# Patient Record
Sex: Male | Born: 1985 | Race: White | Hispanic: No | Marital: Married | Smoking: Former smoker
Health system: Southern US, Community
[De-identification: ages and names within clinical notes are randomized; demographics above are authoritative.]

## PROBLEM LIST (undated history)

## (undated) DIAGNOSIS — D67 Hereditary factor IX deficiency: Secondary | ICD-10-CM

## (undated) DIAGNOSIS — I10 Essential (primary) hypertension: Secondary | ICD-10-CM

## (undated) DIAGNOSIS — K219 Gastro-esophageal reflux disease without esophagitis: Secondary | ICD-10-CM

## (undated) DIAGNOSIS — Z8619 Personal history of other infectious and parasitic diseases: Secondary | ICD-10-CM

## (undated) DIAGNOSIS — IMO0001 Reserved for inherently not codable concepts without codable children: Secondary | ICD-10-CM

## (undated) HISTORY — DX: Essential (primary) hypertension: I10

## (undated) HISTORY — DX: Hereditary factor IX deficiency: D67

## (undated) HISTORY — DX: Personal history of other infectious and parasitic diseases: Z86.19

## (undated) HISTORY — DX: Reserved for inherently not codable concepts without codable children: IMO0001

## (undated) HISTORY — DX: Gastro-esophageal reflux disease without esophagitis: K21.9

---

## 2005-07-18 ENCOUNTER — Emergency Department: Payer: Self-pay | Admitting: Internal Medicine

## 2005-07-25 ENCOUNTER — Emergency Department: Payer: Self-pay | Admitting: Emergency Medicine
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
## 2008-01-02 ENCOUNTER — Emergency Department: Payer: Self-pay | Admitting: Emergency Medicine

## 2008-03-15 HISTORY — PX: ELBOW ARTHROSCOPY W/ SYNOVECTOMY: SHX1491

## 2013-02-02 ENCOUNTER — Ambulatory Visit: Payer: Self-pay | Admitting: Family Medicine

## 2013-03-15 DIAGNOSIS — Z8619 Personal history of other infectious and parasitic diseases: Secondary | ICD-10-CM

## 2013-03-15 HISTORY — DX: Personal history of other infectious and parasitic diseases: Z86.19

## 2013-04-05 ENCOUNTER — Ambulatory Visit: Payer: Self-pay | Admitting: Family Medicine

## 2015-03-05 ENCOUNTER — Telehealth: Payer: Self-pay | Admitting: Family Medicine

## 2015-03-05 NOTE — Telephone Encounter (Signed)
Please advise 

## 2015-03-05 NOTE — Telephone Encounter (Signed)
Pt's mother called to reschedule NP appt that was cancelled twice in the past. Pt would like to be seen now. Dr. Nicanor AlconGutierrez's next NP spot is in March. Pt's mother said pt is concerned about his bp. He had gone to the eye doctor and dentist and both said his bp was high. They are wanting a sooner appt. He is currently scheduled for 06/02/15, can we find a 30 spot sooner to get him in or leave him in that spot?

## 2015-03-05 NOTE — Telephone Encounter (Signed)
May schedule in open 30 min slot next week (Tuesday).

## 2015-03-06 NOTE — Telephone Encounter (Signed)
Pt called back and made an appt for 03/11/15.

## 2015-03-06 NOTE — Telephone Encounter (Signed)
Attempted to reach pt's mother, no voicemail set up. Left message on patient's voicemail to call back and schedule appt for next week per Dr. Sharen HonesGutierrez.

## 2015-03-11 ENCOUNTER — Encounter: Payer: Self-pay | Admitting: Family Medicine

## 2015-03-11 ENCOUNTER — Ambulatory Visit (INDEPENDENT_AMBULATORY_CARE_PROVIDER_SITE_OTHER): Payer: 59 | Admitting: Family Medicine

## 2015-03-11 VITALS — BP 154/94 | HR 84 | Temp 98.1°F | Ht 73.0 in | Wt 285.5 lb

## 2015-03-11 DIAGNOSIS — I10 Essential (primary) hypertension: Secondary | ICD-10-CM | POA: Diagnosis not present

## 2015-03-11 DIAGNOSIS — Z8619 Personal history of other infectious and parasitic diseases: Secondary | ICD-10-CM | POA: Insufficient documentation

## 2015-03-11 DIAGNOSIS — K219 Gastro-esophageal reflux disease without esophagitis: Secondary | ICD-10-CM

## 2015-03-11 DIAGNOSIS — E669 Obesity, unspecified: Secondary | ICD-10-CM | POA: Insufficient documentation

## 2015-03-11 DIAGNOSIS — D67 Hereditary factor IX deficiency: Secondary | ICD-10-CM | POA: Diagnosis not present

## 2015-03-11 DIAGNOSIS — IMO0001 Reserved for inherently not codable concepts without codable children: Secondary | ICD-10-CM

## 2015-03-11 MED ORDER — AMLODIPINE BESYLATE 5 MG PO TABS: 5.0000 mg | ORAL_TABLET | Freq: Every day | ORAL | Status: DC

## 2015-03-11 NOTE — Progress Notes (Signed)
Pre visit review using our clinic review tool, if applicable. No additional management support is needed unless otherwise documented below in the visit note. 

## 2015-03-11 NOTE — Assessment & Plan Note (Addendum)
Gives story of elevated readings over the past year.  Elevated on rpt. Discussed DASH diet, reasons to treat hypertension . Will start amlodipine 5mg  daily, advised monitor for flushing and ankle edema. Discussed hereditary vs body habitus contribution. RTC 4-6 wks f/u visit. Baseline EKG today. Baseline labs today. EKG NSR rate 70s, normal axis, intervals, no acute ST/T changes

## 2015-03-11 NOTE — Assessment & Plan Note (Addendum)
Check H pylori stool Ag. Discussed dietary approaches to improve GERD sxs.  Discussed PPI use.  Consider GI referral if no improvement.

## 2015-03-11 NOTE — Assessment & Plan Note (Signed)
Discussed healthy lifestyle and dietary approaches to affect sustainable weight loss. Encouraged increased aerobic exercise.

## 2015-03-11 NOTE — Patient Instructions (Addendum)
labwork today. EKG today. Urine check today. Start amlodipine 5mg  daily sent to your pharmacy. Your goal blood pressure is <140/90. Keep an eye on blood oressure at home or your local pharmacy Work on low salt/sodium diet - goal <1.5gm (1,500mg ) per day. Eat a diet high in fruits/vegetables and whole grains.  Look into mediterranean and DASH diet. Goal activity is 15950min/wk of moderate intensity exercise.  This can be split into 30 minute chunks.  If you are not at this level, you can start with smaller 10-15 min increments and slowly build up activity. Look at www.heart.org for more resources  Return in 4-6 wks for follow up. Ok to continue nexium.  DASH Eating Plan DASH stands for "Dietary Approaches to Stop Hypertension." The DASH eating plan is a healthy eating plan that has been shown to reduce high blood pressure (hypertension). Additional health benefits may include reducing the risk of type 2 diabetes mellitus, heart disease, and stroke. The DASH eating plan may also help with weight loss. WHAT DO I NEED TO KNOW ABOUT THE DASH EATING PLAN? For the DASH eating plan, you will follow these general guidelines:  Choose foods with a percent daily value for sodium of less than 5% (as listed on the food label).  Use salt-free seasonings or herbs instead of table salt or sea salt.  Check with your health care provider or pharmacist before using salt substitutes.  Eat lower-sodium products, often labeled as "lower sodium" or "no salt added."  Eat fresh foods.  Eat more vegetables, fruits, and low-fat dairy products.  Choose whole grains. Look for the word "whole" as the first word in the ingredient list.  Choose fish and skinless chicken or [place] more often than red meat. Limit fish, poultry, and meat to 6 oz (170 g) each day.  Limit sweets, desserts, sugars, and sugary drinks.  Choose heart-healthy fats.  Limit cheese to 1 oz (28 g) per day.  Eat more home-cooked food and less  restaurant, buffet, and fast food.  Limit fried foods.  Cook foods using methods other than frying.  Limit canned vegetables. If you do use them, rinse them well to decrease the sodium.  When eating at a restaurant, ask that your food be prepared with less salt, or no salt if possible. WHAT FOODS CAN I EAT? Seek help from a dietitian for individual calorie needs. Grains Whole grain or whole wheat bread. Brown rice. Whole grain or whole wheat pasta. Quinoa, bulgur, and whole grain cereals. Low-sodium cereals. Corn or whole wheat flour tortillas. Whole grain cornbread. Whole grain crackers. Low-sodium crackers. Vegetables Fresh or frozen vegetables (raw, steamed, roasted, or grilled). Low-sodium or reduced-sodium tomato and vegetable juices. Low-sodium or reduced-sodium tomato sauce and paste. Low-sodium or reduced-sodium canned vegetables.  Fruits All fresh, canned (in natural juice), or frozen fruits. Meat and Other Protein Products Ground beef (85% or leaner), grass-fed beef, or beef trimmed of fat. Skinless chicken or [place]key. Ground chicken or [place]key. Pork trimmed of fat. All fish and seafood. Eggs. Dried beans, peas, or lentils. Unsalted nuts and seeds. Unsalted canned beans. Dairy Low-fat dairy products, such as skim or 1% milk, 2% or reduced-fat cheeses, low-fat ricotta or cottage cheese, or plain low-fat yogurt. Low-sodium or reduced-sodium cheeses. Fats and Oils Tub margarines without trans fats. Light or reduced-fat mayonnaise and salad dressings (reduced sodium). Avocado. Safflower, olive, or canola oils. Natural peanut or almond butter. Other Unsalted popcorn and pretzels. The items listed above may not be a complete list  of recommended foods or beverages. Contact your dietitian for more options. WHAT FOODS ARE NOT RECOMMENDED? Grains White bread. White pasta. White rice. Refined cornbread. Bagels and croissants. Crackers that contain trans fat. Vegetables Creamed or fried  vegetables. Vegetables in a cheese sauce. Regular canned vegetables. Regular canned tomato sauce and paste. Regular tomato and vegetable juices. Fruits Dried fruits. Canned fruit in light or heavy syrup. Fruit juice. Meat and Other Protein Products Fatty cuts of meat. Ribs, chicken wings, bacon, sausage, bologna, salami, chitterlings, fatback, hot dogs, bratwurst, and packaged luncheon meats. Salted nuts and seeds. Canned beans with salt. Dairy Whole or 2% milk, cream, half-and-half, and cream cheese. Whole-fat or sweetened yogurt. Full-fat cheeses or blue cheese. Nondairy creamers and whipped toppings. Processed cheese, cheese spreads, or cheese curds. Condiments Onion and garlic salt, seasoned salt, table salt, and sea salt. Canned and packaged gravies. Worcestershire sauce. Tartar sauce. Barbecue sauce. Teriyaki sauce. Soy sauce, including reduced sodium. Steak sauce. Fish sauce. Oyster sauce. Cocktail sauce. Horseradish. Ketchup and mustard. Meat flavorings and tenderizers. Bouillon cubes. Hot sauce. Tabasco sauce. Marinades. Taco seasonings. Relishes. Fats and Oils Butter, stick margarine, lard, shortening, ghee, and bacon fat. Coconut, palm kernel, or palm oils. Regular salad dressings. Other Pickles and olives. Salted popcorn and pretzels. The items listed above may not be a complete list of foods and beverages to avoid. Contact your dietitian for more information. WHERE CAN I FIND MORE INFORMATION? National Heart, Lung, and Blood Institute: travelstabloid.com   This information is not intended to replace advice given to you by your health care provider. Make sure you discuss any questions you have with your health care provider.   Document Released: 02/18/2011 Document Revised: 03/22/2014 Document Reviewed: 01/03/2013 Elsevier Interactive Patient Education [employer].

## 2015-03-11 NOTE — Assessment & Plan Note (Addendum)
[facility]

## 2015-03-11 NOTE — Progress Notes (Addendum)
BP 154/94 mmHg  Pulse 84  Temp(Src) 98.1 F (36.7 C) (Oral)  Ht 6\' 1"  (1.854 m)  Wt 285 lb 8 oz (129.502 kg)  BMI 37.68 kg/m2   CC: new pt to establish  Subjective:    Patient ID: Michael Barker, male    DOB: 08-01-1985, 29 y.o.   MRN: [id number]  HPI: Michael Barker is a 29 y.o. male presenting on 03/11/2015 for Establish Care   Presents with GF today. Rescheduled last 2 new pt appointments over last few years.  BP elevated last several times this has been checked over the past year. No vision changes, CP/tightness, SOB, leg swelling. Mild headache for 2 days recently.   Gaining weight over the past year - working out. Heavy lifting.   GERD over last several years - currently on nexium 14d course. Also takes tums regularly. Some dysphagia as well. Denies nausea/vomiting, odynophagia, early satiety. GF has been treated several times for H pylori.  Lives alone, with dog. Has GF Edu: AA Occupation: sign Estate agentdesign Activity: weight lifter, gym 2x/wk Diet: good water, some fruits/vegetables, lean meats and eggs  Relevant past medical, surgical, family and social history reviewed and updated as indicated. Interim medical history since our last visit reviewed. Allergies and medications reviewed and updated. No current outpatient prescriptions on file prior to visit.   No current facility-administered medications on file prior to visit.    Review of Systems Per HPI unless specifically indicated in ROS section     Objective:    BP 154/94 mmHg  Pulse 84  Temp(Src) 98.1 F (36.7 C) (Oral)  Ht 6\' 1"  (1.854 m)  Wt 285 lb 8 oz (129.502 kg)  BMI 37.68 kg/m2  Wt Readings from Last 3 Encounters:  03/11/15 285 lb 8 oz (129.502 kg)    Physical Exam  Constitutional: He is oriented to person, place, and time. He appears well-developed and well-nourished. No distress.  HENT:  Head: Normocephalic and atraumatic.  Right Ear: Hearing normal.  Left Ear: Hearing normal.    Mouth/Throat: Uvula is midline, oropharynx is clear and moist and mucous membranes are normal. No oropharyngeal exudate, posterior oropharyngeal edema or posterior oropharyngeal erythema.  Eyes: Conjunctivae and EOM are normal. Pupils are equal, round, and reactive to light. No scleral icterus.  Neck: Normal range of motion. Neck supple.  Cardiovascular: Normal rate, regular rhythm, normal heart sounds and intact distal pulses.   No murmur heard. Pulses:      Radial pulses are 2+ on the right side, and 2+ on the left side.  Pulmonary/Chest: Effort normal and breath sounds normal. No respiratory distress. He has no wheezes. He has no rales.  Musculoskeletal: Normal range of motion. He exhibits no edema.  Lymphadenopathy:    He has no cervical adenopathy.  Neurological: He is alert and oriented to person, place, and time.  CN grossly intact, station and gait intact  Skin: Skin is warm and dry. No rash noted.  Psychiatric: He has a normal mood and affect. His behavior is normal. Judgment and thought content normal.  Nursing note and vitals reviewed.  No results found for this or any previous visit.    Assessment & Plan:   Problem List Items Addressed This Visit    Obesity, Class II, BMI 35-39.9, with comorbidity (HCC)    Discussed healthy lifestyle and dietary approaches to affect sustainable weight loss. Encouraged increased aerobic exercise.      HTN (hypertension) - Primary    Gives story  of elevated readings over the past year.  Elevated on rpt. Discussed DASH diet, reasons to treat hypertension . Will start amlodipine  daily, advised monitor for flushing and ankle edema. Discussed hereditary vs body habitus contribution. RTC 4-6 wks f/u visit. Baseline EKG today. Baseline labs today. EKG NSR rate 70s, normal axis, intervals, no acute ST/T changes      Relevant Medications   amLODipine (NORVASC) 5 MG tablet   Other Relevant Orders   TSH   Basic metabolic panel   EKG  12-Lead (Completed)   Microalbumin / creatinine urine ratio   Hemophilia B (HCC)    Have requested records from [facility] today.      GERD (gastroesophageal reflux disease)    Check H pylori stool Ag. Discussed dietary approaches to improve GERD sxs.  Discussed PPI use.  Consider GI referral if no improvement.      Relevant Medications   esomeprazole (NEXIUM) 40 MG capsule   Other Relevant Orders   H. pylori antigen, stool       Follow up plan: Return in about 4 weeks (around 04/08/2015), or as needed, for follow up visit.

## 2015-03-12 ENCOUNTER — Telehealth: Payer: Self-pay | Admitting: Family Medicine

## 2015-03-12 ENCOUNTER — Encounter: Payer: Self-pay | Admitting: *Deleted

## 2015-03-12 LAB — BASIC METABOLIC PANEL
BUN: 23 mg/dL (ref 6–23)
CALCIUM: 10 mg/dL (ref 8.4–10.5)
CO2: 25 mEq/L (ref 19–32)
CREATININE: 1.24 mg/dL (ref 0.40–1.50)
Chloride: 103 mEq/L (ref 96–112)
GFR: 71.92 mL/min (ref >60.00)
GLUCOSE: 90 mg/dL (ref 70–99)
Potassium: 4 mEq/L (ref 3.5–5.1)
Sodium: 139 mEq/L (ref 135–145)

## 2015-03-12 LAB — MICROALBUMIN / CREATININE URINE RATIO
Creatinine,U: 59 mg/dL
Microalb Creat Ratio: 1.2 mg/g (ref 0.0–30.0)

## 2015-03-12 LAB — TSH: TSH: 1.8 u[IU]/mL (ref 0.35–4.50)

## 2015-03-12 NOTE — Telephone Encounter (Signed)
Patient called to get his lab results.  Please call patient. °

## 2015-03-13 NOTE — Telephone Encounter (Signed)
See lab results. Still awaiting stool test.

## 2015-03-13 NOTE — Telephone Encounter (Signed)
Spoke with patient.

## 2015-03-14 LAB — H. PYLORI ANTIGEN, STOOL: H pylori Ag, Stl: NEGATIVE

## 2015-04-05 ENCOUNTER — Encounter: Payer: Self-pay | Admitting: Family Medicine

## 2015-04-10 ENCOUNTER — Encounter: Payer: Self-pay | Admitting: Family Medicine

## 2015-04-10 ENCOUNTER — Ambulatory Visit (INDEPENDENT_AMBULATORY_CARE_PROVIDER_SITE_OTHER): Payer: 59 | Admitting: Family Medicine

## 2015-04-10 VITALS — BP 136/94 | HR 80 | Temp 98.0°F | Wt 280.2 lb

## 2015-04-10 DIAGNOSIS — IMO0001 Reserved for inherently not codable concepts without codable children: Secondary | ICD-10-CM

## 2015-04-10 DIAGNOSIS — I10 Essential (primary) hypertension: Secondary | ICD-10-CM | POA: Diagnosis not present

## 2015-04-10 DIAGNOSIS — K219 Gastro-esophageal reflux disease without esophagitis: Secondary | ICD-10-CM | POA: Diagnosis not present

## 2015-04-10 MED ORDER — AMLODIPINE BESYLATE 10 MG PO TABS: 10.0000 mg | ORAL_TABLET | Freq: Every day | ORAL | Status: DC

## 2015-04-10 NOTE — Assessment & Plan Note (Signed)
Congratulated on recent healthy changes, weight loss noted.

## 2015-04-10 NOTE — Progress Notes (Signed)
BP 136/94 mmHg  Pulse 80  Temp(Src) 98 F (36.7 C) (Oral)  Wt 280 lb 4 oz (127.121 kg)   CC: 1 mo f/u visit  Subjective:    Patient ID: Michael Barker, male    DOB: 1985-12-20, 30 y.o.   MRN: [id number]  HPI: Michael Barker is a 30 y.o. male presenting on 04/10/2015 for Follow-up   See prior note for details - briefly, established care 03/11/2015 with hypertension and GERD. H pylori test negative. Started on amlodipine  daily. 5lb weight loss with increased aerobic exercise.   Goal is to come off blood pressure regimen.  Has increased cardio (eliptical). Changing diet. Avoiding salt. Drinking water, eating fruits/vegetables. Feels this is sustainable.   GERD improved with healthy diet and lifestyle changes.  Relevant past medical, surgical, family and social history reviewed and updated as indicated. Interim medical history since our last visit reviewed. Allergies and medications reviewed and updated. Current Outpatient Prescriptions on File Prior to Visit  Medication Sig  . Coagulation Factor IX, rFIXFc, (ALPROLIX IV) Inject into the vein as directed. IV every 10 days  . Multiple Vitamins-Minerals (MULTIVITAMIN ADULT PO) Take 1 tablet by mouth daily. Alive  . Creatine 5000 MG PACK Take 1 packet by mouth 2 (two) times a week. Reported on 04/10/2015  . esomeprazole (NEXIUM) 40 MG capsule Take 40 mg by mouth daily at 12 noon. Reported on 04/10/2015   No current facility-administered medications on file prior to visit.    Review of Systems Per HPI unless specifically indicated in ROS section     Objective:    BP 136/94 mmHg  Pulse 80  Temp(Src) 98 F (36.7 C) (Oral)  Wt 280 lb 4 oz (127.121 kg)  Wt Readings from Last 3 Encounters:  04/10/15 280 lb 4 oz (127.121 kg)  03/11/15 285 lb 8 oz (129.502 kg)   Body mass index is 36.98 kg/(m^2).  Physical Exam  Constitutional: He appears well-developed and well-nourished. No distress.  HENT:  Head: Normocephalic  and atraumatic.  Mouth/Throat: Oropharynx is clear and moist. No oropharyngeal exudate.  Eyes: Conjunctivae and EOM are normal. Pupils are equal, round, and reactive to light.  Neck: Normal range of motion. Neck supple. No thyromegaly present.  Cardiovascular: Normal rate, regular rhythm, normal heart sounds and intact distal pulses.   No murmur heard. Pulmonary/Chest: Effort normal and breath sounds normal. No respiratory distress. He has no wheezes. He has no rales.  Musculoskeletal: He exhibits no edema.  Lymphadenopathy:    He has no cervical adenopathy.  Skin: Skin is warm and dry. No rash noted.  Psychiatric: He has a normal mood and affect.  Nursing note and vitals reviewed.  Results for orders placed or performed in visit on 03/11/15  TSH  Result Value Ref Range   TSH 1.80 0.35 - 4.50 uIU/mL  Basic metabolic panel  Result Value Ref Range   Sodium 139 135 - 145 mEq/L   Potassium 4.0 3.5 - 5.1 mEq/L   Chloride 103 96 - 112 mEq/L   CO2 25 19 - 32 mEq/L   Glucose, Bld 90 70 - 99 mg/dL   BUN 23 6 - 23 mg/dL   Creatinine, Ser 4.09 0.40 - 1.50 mg/dL   Calcium 81.1 8.4 - 91.4 mg/dL   GFR 78.29 >56.21 mL/min  H. pylori antigen, stool  Result Value Ref Range   H pylori Ag, Stl Negative Negative  Microalbumin / creatinine urine ratio  Result Value Ref Range  Microalb, Ur <0.7 0.0 - 1.9 mg/dL   Creatinine,U 16.1 mg/dL   Microalb Creat Ratio 1.2 0.0 - 30.0 mg/g      Assessment & Plan:   Problem List Items Addressed This Visit    Obesity, Class II, BMI 35-39.9, with comorbidity (HCC)    Congratulated on recent healthy changes, weight loss noted.       HTN (hypertension) - Primary    Improving readings but diastolic remains elevated. Will increase amlodipine to  daily. Update in 2 wks with readings. Pt will buy large cuff to monitor at home. Pt agrees with plan. If bp well controlled, RTC 1 yr CPE      Relevant Medications   amLODipine (NORVASC) 10 MG tablet   GERD  (gastroesophageal reflux disease)    Improved with healthy diet changes and weight loss. Off PPI.           Follow up plan: Return in about 1 year (around 04/09/2016), or if symptoms worsen or fail to improve, for annual exam, prior fasting for blood work.

## 2015-04-10 NOTE — Progress Notes (Signed)
Pre visit review using our clinic review tool, if applicable. No additional management support is needed unless otherwise documented below in the visit note. 

## 2015-04-10 NOTE — Assessment & Plan Note (Signed)
Improved with healthy diet changes and weight loss. Off PPI.

## 2015-04-10 NOTE — Patient Instructions (Addendum)
Let's increase amlodipine to  daily - double up on current dose until you run out then start higher dose at pharmacy. Give me an update with blood pressure readings in 2 weeks.  Return as needed or in 1 year for physical

## 2015-04-10 NOTE — Assessment & Plan Note (Signed)
Improving readings but diastolic remains elevated. Will increase amlodipine to  daily. Update in 2 wks with readings. Pt will buy large cuff to monitor at home. Pt agrees with plan. If bp well controlled, RTC 1 yr CPE

## 2015-04-24 ENCOUNTER — Telehealth: Payer: Self-pay

## 2015-04-24 ENCOUNTER — Telehealth: Payer: Self-pay | Admitting: Family Medicine

## 2015-04-24 DIAGNOSIS — I10 Essential (primary) hypertension: Secondary | ICD-10-CM

## 2015-04-24 MED ORDER — BENAZEPRIL HCL 10 MG PO TABS: 10.0000 mg | ORAL_TABLET | Freq: Every day | ORAL | Status: DC

## 2015-04-24 MED ORDER — AMLODIPINE BESYLATE 5 MG PO TABS: 5.0000 mg | ORAL_TABLET | Freq: Every day | ORAL | Status: DC

## 2015-04-24 NOTE — Telephone Encounter (Signed)
Pt notified of Dr. Timoteo Expose comments / instructions and that Rxs were sent to pharmacy. Pt was not around calender so he will call back to schedule lab appt and he will also call back to update [place] on BP in about 2-3 weeks

## 2015-04-24 NOTE — Telephone Encounter (Addendum)
Let's decrease amlodipine back to  daily. Would recommend start second bp med benazepril  sent to pharmacy. Ok to take both in AM Return for lab visit only in ~10d and update [place] with readings and effect in 2-3 wks.

## 2015-04-24 NOTE — Telephone Encounter (Signed)
See phone note from [facility] earlier today.

## 2015-04-24 NOTE — Telephone Encounter (Signed)
Patient Name: Michael Barker  DOB: [birth year]    Initial Comment Caller states he's having ankle swelling.   Nurse Assessment  Nurse: Vickey Sages, RN, Jacquilin Date/Time (Eastern Time): 04/24/2015 10:56:56 AM  Confirm and document reason for call. If symptomatic, describe symptoms. You must click the next button to save text entered. ---Caller states he's having ankle swelling. Caller states he spoke with the nurse last night (call ID [phone]) Caller states he is calling because the swelling started after increasing his Amalodipine about 1 week ago. The patient states he would like to have a call back from someone in the office or his PCP.  Has the patient traveled out of the country within the last 30 days? ---No  Does the patient have any new or worsening symptoms? ---Yes  Will a triage be completed? ---Yes  Related visit to physician within the last 2 weeks? ---Yes  Does the PT have any chronic conditions? (i.e. diabetes, asthma, etc.) ---Yes  List chronic conditions. ---HTN. GERD  Is this a behavioral health or substance abuse call? ---No     Guidelines    Guideline Title Affirmed Question Affirmed Notes  Leg Swelling and Edema [1] MILD swelling of both ankles (i.e., pedal edema) AND [2] new onset or worsening    Final Disposition User   See PCP When Office is Open (within 3 days) Vickey Sages, RN, Jacquilin    Disagree/Comply: Comply   PATIENT REQUEST CALL BACK FROM MD

## 2015-04-24 NOTE — Telephone Encounter (Signed)
PLEASE NOTE: All timestamps contained within this report are represented as [place] Standard Time. CONFIDENTIALTY NOTICE: This fax transmission is intended only for the addressee. It contains information that is legally privileged, confidential or otherwise protected from use or disclosure. If you are not the intended recipient, you are strictly prohibited from reviewing, disclosing, copying using or disseminating any of this information or taking any action in reliance on or regarding this information. If you have received this fax in error, please notify us immediately by telephone so that we can arrange for its return to [place]. Phone: [phone], Toll-Free: [phone], Fax: [phone] Page: 1 of 2 Call Id: [id number] [facility] Night - Client TELEPHONE ADVICE RECORD [facility] Call Center Patient Name: Michael Barker Gender: Male DOB: [birth year] Age: 30 Y 10 M 10 D Return Phone Number: [phone] (Primary) Address: City/State/Zip: [place] Client [facility] Night - Client Client Site [facility] - Night Physician Eustaquio Boyden Contact Type Fax Call Type Triage / Clinical Caller Name Karan Relationship To Patient Self Return Phone Number [phone] (Primary) Chief Complaint Medication reaction Initial Comment Caller states he is having side effects from Amalodipine. Dr. double dose 1 week ago. Now ankles are swollen PreDisposition Call Doctor Translation No Nurse Assessment Nurse: Nicanor Bake, RN, Marylene Land Date/Time (Eastern Time): 04/23/2015 7:28:33 PM Confirm and document reason for call. If symptomatic, describe symptoms. You must click the next button to save text entered. ---Caller states a week or so ago, his Amlodipine was increased  daily and he was taking 5. He started to notice ankle swelling on/off for at least a week. Now they are visibly swollen "I wouldn't say it's bad, but it's good". He  states he took  for about a month with no problem. Has the patient traveled out of the country within the last 30 days? ---No Does the patient have any new or worsening symptoms? ---Yes Will a triage be completed? ---Yes Related visit to physician within the last 2 weeks? ---Yes Does the PT have any chronic conditions? (i.e. diabetes, asthma, etc.) ---Yes List chronic conditions. ---HTN, severe hemophiliac Is this a behavioral health or substance abuse call? ---No Guidelines Guideline Title Affirmed Question Affirmed Notes Nurse Date/Time Lamount Cohen Time) Leg Swelling and Edema [1] MILD swelling of both ankles (i.e., pedal edema) AND [2] new onset or worsening [place], Paramedic 04/23/2015 7:32:03 PM Disp. Time Lamount Cohen Time) Disposition Final User 04/23/2015 7:35:02 PM See PCP When Office is Open (within 3 days) [place], Charity fundraiser, Marylene Land PLEASE NOTE: All timestamps contained within this report are represented as [place] Standard Time. CONFIDENTIALTY NOTICE: This fax transmission is intended only for the addressee. It contains information that is legally privileged, confidential or otherwise protected from use or disclosure. If you are not the intended recipient, you are strictly prohibited from reviewing, disclosing, copying using or disseminating any of this information or taking any action in reliance on or regarding this information. If you have received this fax in error, please notify us immediately by telephone so that we can arrange for its return to [place]. Phone: [phone], Toll-Free: [phone], Fax: [phone] Page: 2 of 2 Call Id: [id number] 04/23/2015 7:35:06 PM See PCP When Office is Open (within 3 days) Yes [place], RN, Rosalyn Charters Understands: Yes Disagree/Comply: Comply Care Advice Given Per Guideline SEE PCP WITHIN 3 DAYS: LEG SWELLING AND/OR EDEMA: * Elevate your legs or try to lay down once or twice daily for 20 minutes. * Avoid socks with an  elastic band at the top. Wear  comfortable shoes. CALL BACK IF: * Swelling becomes worse * Swelling becomes red or painful to the touch * Calf pain occurs and becomes constant * You become worse. CARE ADVICE given per Leg Swelling and Edema (Adult) guideline. Referrals REFERRED TO PCP OFFICE

## 2015-05-07 LAB — COMPREHENSIVE METABOLIC PANEL
Creat: 1.01
Glucose: 97
Magnesium: 1.9
Potassium: 4.7 mmol/L
Sodium: 139

## 2015-05-08 ENCOUNTER — Telehealth: Payer: Self-pay | Admitting: Family Medicine

## 2015-05-08 ENCOUNTER — Other Ambulatory Visit: Payer: 59

## 2015-05-08 NOTE — Telephone Encounter (Signed)
Pt notified of Dr. Timoteo Expose comments/instructions and verbalized understanding, pt did want me to let Dr. Reece Agar know his BP yesterday was 146/76 so it is doing better

## 2015-05-08 NOTE — Telephone Encounter (Signed)
plz notify - I received copy of labs from [facility]. Kidneys doing fine on current med. Continue as is, update if concerns or bp staying elevated.

## 2015-05-15 ENCOUNTER — Encounter: Payer: Self-pay | Admitting: *Deleted

## 2015-06-02 ENCOUNTER — Ambulatory Visit: Payer: Self-pay | Admitting: Family Medicine

## 2015-08-19 ENCOUNTER — Other Ambulatory Visit: Payer: Self-pay | Admitting: Family Medicine

## 2015-12-09 ENCOUNTER — Ambulatory Visit (INDEPENDENT_AMBULATORY_CARE_PROVIDER_SITE_OTHER): Payer: 59 | Admitting: Family

## 2015-12-09 ENCOUNTER — Encounter: Payer: Self-pay | Admitting: Family

## 2015-12-09 ENCOUNTER — Ambulatory Visit (INDEPENDENT_AMBULATORY_CARE_PROVIDER_SITE_OTHER): Payer: 59

## 2015-12-09 VITALS — BP 122/86 | HR 82 | Temp 98.7°F | Wt 259.0 lb

## 2015-12-09 DIAGNOSIS — R05 Cough: Secondary | ICD-10-CM

## 2015-12-09 DIAGNOSIS — K922 Gastrointestinal hemorrhage, unspecified: Secondary | ICD-10-CM | POA: Insufficient documentation

## 2015-12-09 DIAGNOSIS — R059 Cough, unspecified: Secondary | ICD-10-CM

## 2015-12-09 MED ORDER — AZITHROMYCIN 250 MG PO TABS: ORAL_TABLET | ORAL | 0 refills | Status: DC

## 2015-12-09 MED ORDER — ALBUTEROL SULFATE HFA 108 (90 BASE) MCG/ACT IN AERS: 2.0000 | INHALATION_SPRAY | Freq: Four times a day (QID) | RESPIRATORY_TRACT | 1 refills | Status: DC | PRN

## 2015-12-09 NOTE — Progress Notes (Signed)
Subjective:    Patient ID: Michael Barker, male    DOB: 02-Jan-1986, 30 y.o.   MRN: [id number]  CC: Michael Barker is a 30 y.o. male who presents today for an acute visit.    HPI: Patient here for acute visit with chief of mostly dry cough 1 week, waxing or waning. Endorses nasal congestion. He's been taking Mucinex OTC without relief. Endorses wheeze, sore throat.  No fever, chills, SOB.   Seasonal allergies.     HISTORY:  Past Medical History:  Diagnosis Date  . GERD (gastroesophageal reflux disease)   . 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 HTN (hypertension)   . Hx of hepatitis C 2015   genotype 1, successful treatment with clinical trial med  . Obesity, Class II, BMI 35-39.9, with comorbidity (HCC)    Past Surgical History:  Procedure Laterality Date  . ELBOW ARTHROSCOPY W/ SYNOVECTOMY Left 2010   for recurrent bleeding   Family History  Problem Relation Age of Onset  . Cancer Mother     breast  . Hyperlipidemia Father   . Hypertension Mother   . CAD Neg Hx   . Stroke Neg Hx   . Diabetes Neg Hx     Allergies: Asa [aspirin] Current Outpatient Prescriptions on File Prior to Visit  Medication Sig Dispense Refill  . amLODipine (NORVASC) 5 MG tablet TAKE 1 TABLET (5 MG TOTAL) BY MOUTH DAILY. 30 tablet 6  . benazepril (LOTENSIN) 10 MG tablet TAKE 1 TABLET (10 MG TOTAL) BY MOUTH DAILY. 30 tablet 6  . Coagulation Factor IX, rFIXFc, (ALPROLIX IV) Inject into the vein as directed. IV every 10 days    . Creatine 5000 MG PACK Take 1 packet by mouth 2 (two) times a week. Reported on 04/10/2015    . esomeprazole (NEXIUM) 40 MG capsule Take 40 mg by mouth daily at 12 noon. Reported on 04/10/2015    . Multiple Vitamins-Minerals (MULTIVITAMIN ADULT PO) Take 1 tablet by mouth daily. Alive     No current facility-administered medications on file prior to visit.     Social History  Substance Use Topics  . Smoking  status: Former Smoker    Quit date: 03/16/2011  . Smokeless tobacco: Never Used     Comment: e-cig  . Alcohol use 0.0 oz/week     Comment: on weekends, shots    Review of Systems  Constitutional: Negative for chills and fever.  HENT: Positive for congestion, rhinorrhea and sore throat. Negative for ear pain and sinus pressure.   Respiratory: Positive for cough and wheezing. Negative for shortness of breath.   Cardiovascular: Negative for chest pain and palpitations.  Gastrointestinal: Negative for diarrhea, nausea and vomiting.  Genitourinary: Negative for dysuria.  Musculoskeletal: Negative for myalgias.  Skin: Negative for rash.  Neurological: Negative for headaches.  Hematological: Negative for adenopathy.      Objective:    BP 122/86 (BP Location: Left Arm, Patient Position: Sitting, Cuff Size: Large)   Pulse 82   Temp 98.7 F (37.1 C) (Oral)   Wt 259 lb (117.5 kg)   SpO2 98%   BMI 34.17 kg/m    Physical Exam  Constitutional: Vital signs are normal. He appears well-developed and well-nourished.  HENT:  Head: Normocephalic and atraumatic.  Right Ear: Hearing, tympanic membrane, external ear and ear canal normal. No drainage, swelling or tenderness. Tympanic membrane is not injected, not erythematous and not bulging. No middle  ear effusion. No decreased hearing is noted.  Left Ear: Hearing, tympanic membrane, external ear and ear canal normal. No drainage, swelling or tenderness. Tympanic membrane is not injected, not erythematous and not bulging.  No middle ear effusion. No decreased hearing is noted.  Nose: Nose normal. Right sinus exhibits no maxillary sinus tenderness and no frontal sinus tenderness. Left sinus exhibits no maxillary sinus tenderness and no frontal sinus tenderness.  Mouth/Throat: Uvula is midline, oropharynx is clear and moist and mucous membranes are normal. No oropharyngeal exudate, posterior oropharyngeal edema, posterior oropharyngeal erythema or  tonsillar abscesses.  Eyes: Conjunctivae are normal.  Cardiovascular: Regular rhythm and normal heart sounds.   Pulmonary/Chest: Effort normal and breath sounds normal. No respiratory distress. He has no wheezes. He has no rhonchi. He has no rales.  Decreased BS.  Lymphadenopathy:       Head (right side): No submental, no submandibular, no tonsillar, no preauricular, no posterior auricular and no occipital adenopathy present.       Head (left side): No submental, no submandibular, no tonsillar, no preauricular, no posterior auricular and no occipital adenopathy present.    He has no cervical adenopathy.  Neurological: He is alert.  Skin: Skin is warm and dry.  Psychiatric: He has a normal mood and affect. His speech is normal and behavior is normal.  Vitals reviewed.  Use albuterol every 6 hours for first 24 hours to get good medication into the lungs and loosen congestion; after, you may use as needed and eventually stop all together when cough resolves.     Assessment & Plan:  1. Cough Working diagnosis of bronchitis. Based on duration of symptoms, patient and I jointly agreed to start antibiotic today. Pending chest x-ray to further evaluate for pneumonia. Have a low clinical suspicion for PNA as lung sounds greatly improved after albuterol treatment with no adventitious lung sounds. Patient is also afebrile. Return precautions given.  - albuterol (PROVENTIL HFA) 108 (90 Base) MCG/ACT inhaler; Inhale 2 puffs into the lungs every 6 (six) hours as needed for wheezing or shortness of breath.  Dispense: 1 Inhaler; Refill: 1 - azithromycin (ZITHROMAX) 250 MG tablet; Tale 500 mg PO on day 1, then 250 mg PO q24h x 4 days.  Dispense: 6 tablet; Refill: 0 - DG Chest 2 View     I am having Mr. Michael Barker maintain his (Coagulation Factor IX, rFIXFc, (ALPROLIX IV)), esomeprazole, Multiple Vitamins-Minerals (MULTIVITAMIN ADULT PO), Creatine, benazepril, and amLODipine.   No orders of the defined types  were placed in this encounter.   Return precautions given.   Risks, benefits, and alternatives of the medications and treatment plan prescribed today were discussed, and patient expressed understanding.   Education regarding symptom management and diagnosis given to patient on AVS.  Continue to follow with Eustaquio BoydenJavier Gutierrez, MD for routine health maintenance.   Zettie PhoMichael Brandon Schuhmacher and I agreed with plan.   Rennie PlowmanMargaret Charo Philipp, FNP

## 2015-12-09 NOTE — Patient Instructions (Signed)

## 2016-03-01 ENCOUNTER — Other Ambulatory Visit: Payer: Self-pay | Admitting: Family Medicine

## 2016-03-02 ENCOUNTER — Ambulatory Visit (INDEPENDENT_AMBULATORY_CARE_PROVIDER_SITE_OTHER): Payer: 59 | Admitting: Family Medicine

## 2016-03-02 ENCOUNTER — Encounter: Payer: Self-pay | Admitting: Family Medicine

## 2016-03-02 VITALS — BP 130/88 | HR 87 | Wt 255.0 lb

## 2016-03-02 DIAGNOSIS — R1312 Dysphagia, oropharyngeal phase: Secondary | ICD-10-CM

## 2016-03-02 DIAGNOSIS — I10 Essential (primary) hypertension: Secondary | ICD-10-CM

## 2016-03-02 DIAGNOSIS — E66811 Obesity, class 1: Secondary | ICD-10-CM

## 2016-03-02 DIAGNOSIS — N62 Hypertrophy of breast: Secondary | ICD-10-CM | POA: Diagnosis not present

## 2016-03-02 DIAGNOSIS — F411 Generalized anxiety disorder: Secondary | ICD-10-CM

## 2016-03-02 DIAGNOSIS — E669 Obesity, unspecified: Secondary | ICD-10-CM

## 2016-03-02 MED ORDER — HYDRALAZINE HCL 25 MG PO TABS: 12.5000 mg | ORAL_TABLET | Freq: Two times a day (BID) | ORAL | 0 refills | Status: DC | PRN

## 2016-03-02 MED ORDER — HYDROXYZINE HCL 25 MG PO TABS: 12.5000 mg | ORAL_TABLET | Freq: Two times a day (BID) | ORAL | 0 refills | Status: DC | PRN

## 2016-03-02 NOTE — Assessment & Plan Note (Signed)
Congratulated on weight loss from healthy diet and lifestyle changes. Encouraged perseverance with healthy changes.

## 2016-03-02 NOTE — Progress Notes (Signed)
BP 130/88   Pulse 87   Wt 255 lb (115.7 kg)   SpO2 98%   BMI 33.64 kg/m    CC: f/u visit Subjective:    Patient ID: Michael Barker, male    DOB: 1986/01/31, 30 y.o.   MRN: [id number]  HPI: Michael PhoMichael Brandon Lesko is a 30 y.o. male presenting on 03/02/2016 for Anxiety; Hypertension; and Choking (lump on throat, had a colonoscopy and endoscopy stomach ulcer had them done a t [facility] )   Last seen by me 03/2015. H/o HTN, hemophilia B complicated by GIB earlier this year [facility] He had colonoscopy and endoscopy () with ulcer found at ileocecal valve. Treated with omeprazole then pepcid BID - currently well controlled off any medications. He has had few episodes of dysphagia to raw broccoli. Points to L side of neck. Endorses oropharyngeal dysphagia.   Has noticed residual anxiety since bleeding episode occurred. Quickly searches symptoms on google which increases anxiety.   HTN - Compliant with current antihypertensive regimen of amlodipine 5mg  daily, benazepril 10mg  daily. Does check blood pressures at local grocery store: well controlled.  No low blood pressure readings or symptoms of dizziness/syncope.  Denies HA, vision changes, CP/tightness, SOB, leg swelling.    Noticing some bilateral gynecomastia without pain or drainage. H/o benign mammogram 2014.  H/o hep C s/p treatment with Harvoni.   Overall 25 lb weight loss over last 10 months. Has changed diet - increased proteins, vegetables, fruits.   Non smoker - but does vape.   Relevant past medical, surgical, family and social history reviewed and updated as indicated. Interim medical history since our last visit reviewed. Allergies and medications reviewed and updated. Current Outpatient Prescriptions on File Prior to Visit  Medication Sig  . albuterol (PROVENTIL HFA) 108 (90 Base) MCG/ACT inhaler Inhale 2 puffs into the lungs every 6 (six) hours as needed for wheezing or shortness of breath.  Marland Kitchen. amLODipine (NORVASC)  5 MG tablet TAKE 1 TABLET (5 MG TOTAL) BY MOUTH DAILY.  . benazepril (LOTENSIN) 10 MG tablet TAKE 1 TABLET (10 MG TOTAL) BY MOUTH DAILY.  Marland Kitchen. COAGULATION FACTOR IX IV Inject into the vein.   No current facility-administered medications on file prior to visit.     Review of Systems Per HPI unless specifically indicated in ROS section     Objective:    BP 130/88   Pulse 87   Wt 255 lb (115.7 kg)   SpO2 98%   BMI 33.64 kg/m   Wt Readings from Last 3 Encounters:  03/02/16 255 lb (115.7 kg)  12/09/15 259 lb (117.5 kg)  04/10/15 280 lb 4 oz (127.1 kg)    Physical Exam  Constitutional: He appears well-developed and well-nourished. No distress.  HENT:  Mouth/Throat: Oropharynx is clear and moist. No oropharyngeal exudate.  Neck: Normal range of motion. Neck supple. No thyromegaly present.  Cartilage asymmetry palpated at right throat, no thyroid nodules appreciated  Cardiovascular: Normal rate, regular rhythm, normal heart sounds and intact distal pulses.   No murmur heard. Pulmonary/Chest: Effort normal and breath sounds normal. No respiratory distress. He has no wheezes. He has no rales. Right breast exhibits mass. Left breast exhibits mass.  Bilateral symmetrical gynecomastia (~2cm diameter), nontender  Musculoskeletal: He exhibits no edema.  Lymphadenopathy:    He has no cervical adenopathy.  Skin: Skin is warm and dry. No rash noted.  Psychiatric: His behavior is normal. Thought content normal. His mood appears anxious.  Nursing note  and vitals reviewed.  [facility].     Assessment & Plan:   Problem List Items Addressed This Visit    Anxiety state - Primary    Patient has noticed increasing anxiety since recent GI bleed. Reviewed healthy stress relieving strategies. rec avoid google for medical diagnoses based on his symptoms. Trial hydroxyzine PRN anxiety, with sedation precautions (pt aware not to .      Relevant Medications   hydrOXYzine  (ATARAX/VISTARIL) 25 MG tablet   Gynecomastia    Reviewed, reassuring exam. Continue to monitor. If worsening, consider stopping ACEI.  Pt endorses h/o MJ use, but none in last several years.       HTN (hypertension)    Chronic, stable. Continue current regimen.       Obesity, Class I, BMI 30-34.9    Congratulated on weight loss from healthy diet and lifestyle changes. Encouraged perseverance with healthy changes.       Oropharyngeal dysphagia    Describes rare episodes of oropharyngeal dysphagia. Exam benign today - will continue to monitor. If recurrent dysphagia episode, consider thyroid US.           Follow up plan: Return in about 6 months (around 08/31/2016) for annual exam, prior fasting for blood work.  Eustaquio BoydenJavier Holbert Caples, MD

## 2016-03-02 NOTE — Assessment & Plan Note (Signed)
Patient has noticed increasing anxiety since recent GI bleed. Reviewed healthy stress relieving strategies. rec avoid google for medical diagnoses based on his symptoms. Trial hydroxyzine PRN anxiety, with sedation precautions (pt aware not to .

## 2016-03-02 NOTE — Assessment & Plan Note (Signed)
Describes rare episodes of oropharyngeal dysphagia. Exam benign today - will continue to monitor. If recurrent dysphagia episode, consider thyroid US.

## 2016-03-02 NOTE — Assessment & Plan Note (Signed)
Reviewed, reassuring exam. Continue to monitor. If worsening, consider stopping ACEI.  Pt endorses h/o MJ use, but none in last several years.

## 2016-03-02 NOTE — Patient Instructions (Addendum)
Exam today ok - some gynecomastia present. We will watch. Let me know if worsening to consider change in benazepril. For swallowing - monitor symptoms for now, if recurrent let me know and we will check thyroid ultrasound.  For anxiety - may try hydroxyzine as needed 1/2-1 tablet as needed. Work on healthy stress relieving strategies.  Continue blood pressure medicines as up to now.  Good to see you today, return as needed or in 6 months for physical.

## 2016-03-02 NOTE — Assessment & Plan Note (Signed)
Chronic, stable. Continue current regimen. 

## 2016-03-09 ENCOUNTER — Ambulatory Visit: Payer: 59 | Admitting: Family

## 2016-03-23 DIAGNOSIS — H5213 Myopia, bilateral: Secondary | ICD-10-CM | POA: Diagnosis not present

## 2016-04-02 DIAGNOSIS — R062 Wheezing: Secondary | ICD-10-CM | POA: Diagnosis not present

## 2016-04-02 DIAGNOSIS — K219 Gastro-esophageal reflux disease without esophagitis: Secondary | ICD-10-CM | POA: Diagnosis not present

## 2016-04-07 ENCOUNTER — Ambulatory Visit (INDEPENDENT_AMBULATORY_CARE_PROVIDER_SITE_OTHER): Payer: 59 | Admitting: Family Medicine

## 2016-04-07 ENCOUNTER — Encounter: Payer: Self-pay | Admitting: Family Medicine

## 2016-04-07 VITALS — BP 104/72 | HR 69 | Temp 98.5°F | Ht 73.0 in | Wt 252.8 lb

## 2016-04-07 DIAGNOSIS — J029 Acute pharyngitis, unspecified: Secondary | ICD-10-CM | POA: Diagnosis not present

## 2016-04-07 DIAGNOSIS — I10 Essential (primary) hypertension: Secondary | ICD-10-CM

## 2016-04-07 DIAGNOSIS — R059 Cough, unspecified: Secondary | ICD-10-CM | POA: Insufficient documentation

## 2016-04-07 DIAGNOSIS — R05 Cough: Secondary | ICD-10-CM | POA: Diagnosis not present

## 2016-04-07 NOTE — Progress Notes (Signed)
Pre visit review using our clinic review tool, if applicable. No additional management support is needed unless otherwise documented below in the visit note. 

## 2016-04-07 NOTE — Patient Instructions (Signed)
Strep test is negative I wonder if trauma from coughing has caused throat symptoms  I also wonder if your benazepril could cause cough and tight breathing   Stop it for a week and then update us re: how your cough and throat are   Follow up with Dr Reece AgarG in about 2 weeks

## 2016-04-07 NOTE — Progress Notes (Signed)
Subjective:    Patient ID: Michael Barker, male    DOB: 12/09/1985, 31 y.o.   MRN: [id number]  HPI Here for c/o of sore throat   Off and on for the past 3 days  Throat is a little red  Also some spots on the top of his throat  Right now-not that sore  Mainly when he swallows   Cough/ occ wheezing at the gym  Not productive (but feels a little rattly but nothing moves)   He has some coughing spells   Was seen for a swallowing issues a few weeks ago  Has had hx of gerd issues  Recently had EGD-fall /given carafate and ppi- did not have eosinophilic esophagitis on bx which is good   He is also on an ace   He had some improvement with albuterol in the past   Wt Readings from Last 3 Encounters:  04/07/16 252 lb 12 oz (114.6 kg)  03/02/16 255 lb (115.7 kg)  12/09/15 259 lb (117.5 kg)   Thrilled with wt loss   BP Readings from Last 3 Encounters:  04/07/16 104/72  03/02/16 130/88  12/09/15 122/86     RST is negative today   Patient Active Problem List   Diagnosis Date Noted  . Sore throat 04/07/2016  . Cough 04/07/2016  . Anxiety state 03/02/2016  . Oropharyngeal dysphagia 03/02/2016  . Gynecomastia 03/02/2016  . GIB (gastrointestinal bleeding) 12/09/2015  . Hx of hepatitis C   . Hemophilia B (HCC)   . GERD (gastroesophageal reflux disease)   . HTN (hypertension)   . Obesity, Class I, BMI 30-34.9    Past Medical History:  Diagnosis Date  . GERD (gastroesophageal reflux disease)   . Hemophilia B (HCC)    [facility], h/o elbow arthropathy  . HTN (hypertension)   . Hx of hepatitis C 2015   genotype 1, successful treatment with clinical trial med  . Obesity, Class II, BMI 35-39.9, with comorbidity    Past Surgical History:  Procedure Laterality Date  . ELBOW ARTHROSCOPY W/ SYNOVECTOMY Left 2010   for recurrent bleeding   Social History  Substance Use Topics  . Smoking status: Former Smoker    Quit date: 03/16/2011  .  Smokeless tobacco: Never Used     Comment: e-cig  . Alcohol use 0.0 oz/week     Comment: on weekends, shots   Family History  Problem Relation Age of Onset  . Cancer Mother     breast  . Hypertension Mother   . Hyperlipidemia Father   . CAD Neg Hx   . Stroke Neg Hx   . Diabetes Neg Hx    Allergies  Allergen Reactions  . Asa [Aspirin] Other (See Comments)    Hemophilia  . Nsaids Other (See Comments)    Patient with hemophilia. Bleeding complicaitons   Current Outpatient Prescriptions on File Prior to Visit  Medication Sig Dispense Refill  . amLODipine (NORVASC) 5 MG tablet TAKE 1 TABLET (5 MG TOTAL) BY MOUTH DAILY. 30 tablet 0  . benazepril (LOTENSIN) 10 MG tablet TAKE 1 TABLET (10 MG TOTAL) BY MOUTH DAILY. 30 tablet 0  . COAGULATION FACTOR IX IV Inject into the vein.     No current facility-administered medications on file prior to visit.      Review of Systems Review of Systems  Constitutional: Negative for fever, appetite change, fatigue and unexpected weight change. (pos for intentional wt loss)  ENT pos for mild  ST that is intermittent , neg for nasal cong or rhinorrhea or sinus pain  Eyes: Negative for pain and visual disturbance.  Respiratory: pos for cough/ sens of wheezing and tight chest  Cardiovascular: Negative for cp or palpitations    Gastrointestinal: Negative for nausea, diarrhea and constipation.  Genitourinary: Negative for urgency and frequency.  Skin: Negative for pallor or rash   Neurological: Negative for weakness, light-headedness, numbness and headaches.  Hematological: Negative for adenopathy. Does not bruise/bleed easily.  Psychiatric/Behavioral: Negative for dysphoric mood. The patient is nervous/anxious.         Objective:   Physical Exam  Constitutional: He appears well-developed and well-nourished. No distress.  overwt muscular male -anxious but in no distress  HENT:  Head: Normocephalic and atraumatic.  Right Ear: External ear normal.   Left Ear: External ear normal.  Nose: Nose normal.  Mouth/Throat: Oropharynx is clear and moist. No oropharyngeal exudate.  Some mild injection of posterior (hard) palate consistent with trauma  No ulceration / exudate or swelling noted Swallowing appears normal  Eyes: Conjunctivae and EOM are normal. Pupils are equal, round, and reactive to light. Right eye exhibits no discharge. Left eye exhibits no discharge. No scleral icterus.  Neck: Normal range of motion. Neck supple. No JVD present. Carotid bruit is not present. No thyromegaly present.  Cardiovascular: Normal rate, regular rhythm, normal heart sounds and intact distal pulses.  Exam reveals no gallop.   Pulmonary/Chest: Effort normal and breath sounds normal. No respiratory distress. He has no wheezes. He has no rales. He exhibits no tenderness.  No crackles  Good air exchange   Abdominal: Soft. Bowel sounds are normal. He exhibits no distension, no abdominal bruit and no mass. There is no tenderness.  Musculoskeletal: He exhibits no edema.  Lymphadenopathy:    He has no cervical adenopathy.  Neurological: He is alert. He has normal reflexes. No cranial nerve deficit.  Skin: Skin is warm and dry. No rash noted. No pallor.  Psychiatric: His speech is normal and behavior is normal. Thought content normal. His mood appears anxious. His affect is not blunt, not labile and not inappropriate. He does not exhibit a depressed mood.  Pt is quite anxious today and he voices this  Attributes it to recent health problems           Assessment & Plan:   Problem List Items Addressed This Visit      Cardiovascular and Mediastinum   HTN (hypertension) - Primary    bp has dec with recent wt loss and I have susp that his benazepril may be fueling his cough and throat symptoms   inst him to hold this for a week and update F/u pcp 2 wk for bp check  Unsure if will need another agent         Other   Cough    Rev hx of this and  sensation of tight chest/wheezing and constant throat clearing (causing ST) I do wonder if this is coming from his ace  Will hold benazepril for 1 week and call to update  Also alert [place] if worse or new symptoms      Sore throat    Neg RST I suspect this may be traumatic from throat clearing and hard coughing  Pt is quite anxious about this  Exam re assuring >25 minutes spent in face to face time with patient, >50% spent in counselling or coordination of care  We disc poss of ace cough- will hold this  and report back  Continue PPI F/u PCP 2 weeks       Relevant Orders   POCT rapid strep A (Completed)

## 2016-04-08 LAB — POCT RAPID STREP A (OFFICE): Rapid Strep A Screen: NEGATIVE

## 2016-04-08 NOTE — Assessment & Plan Note (Signed)
bp has dec with recent wt loss and I have susp that his benazepril may be fueling his cough and throat symptoms   inst him to hold this for a week and update F/u pcp 2 wk for bp check  Unsure if will need another agent

## 2016-04-08 NOTE — Assessment & Plan Note (Signed)
Neg RST I suspect this may be traumatic from throat clearing and hard coughing  Pt is quite anxious about this  Exam re assuring >25 minutes spent in face to face time with patient, >50% spent in counselling or coordination of care  We disc poss of ace cough- will hold this and report back  Continue PPI F/u PCP 2 weeks

## 2016-04-08 NOTE — Assessment & Plan Note (Signed)
Rev hx of this and sensation of tight chest/wheezing and constant throat clearing (causing ST) I do wonder if this is coming from his ace  Will hold benazepril for 1 week and call to update  Also alert us if worse or new symptoms

## 2016-04-17 ENCOUNTER — Other Ambulatory Visit: Payer: Self-pay | Admitting: Family Medicine

## 2016-05-12 DIAGNOSIS — Z886 Allergy status to analgesic agent status: Secondary | ICD-10-CM | POA: Diagnosis not present

## 2016-05-12 DIAGNOSIS — M545 Low back pain: Secondary | ICD-10-CM | POA: Diagnosis not present

## 2016-06-30 ENCOUNTER — Other Ambulatory Visit: Payer: Self-pay | Admitting: *Deleted

## 2016-06-30 MED ORDER — AMLODIPINE BESYLATE 5 MG PO TABS: ORAL_TABLET | ORAL | 0 refills | Status: DC

## 2016-07-01 ENCOUNTER — Other Ambulatory Visit: Payer: Self-pay | Admitting: *Deleted

## 2016-10-15 ENCOUNTER — Other Ambulatory Visit: Payer: Self-pay | Admitting: Family Medicine

## 2017-05-30 IMAGING — DX DG CHEST 2V
2 series · 2 of 2 positions shown · non-contrast
Comparison: None.

CLINICAL DATA: Cough for 1 week, wheezing

EXAM:
CHEST  2 VIEW

[chest pa]
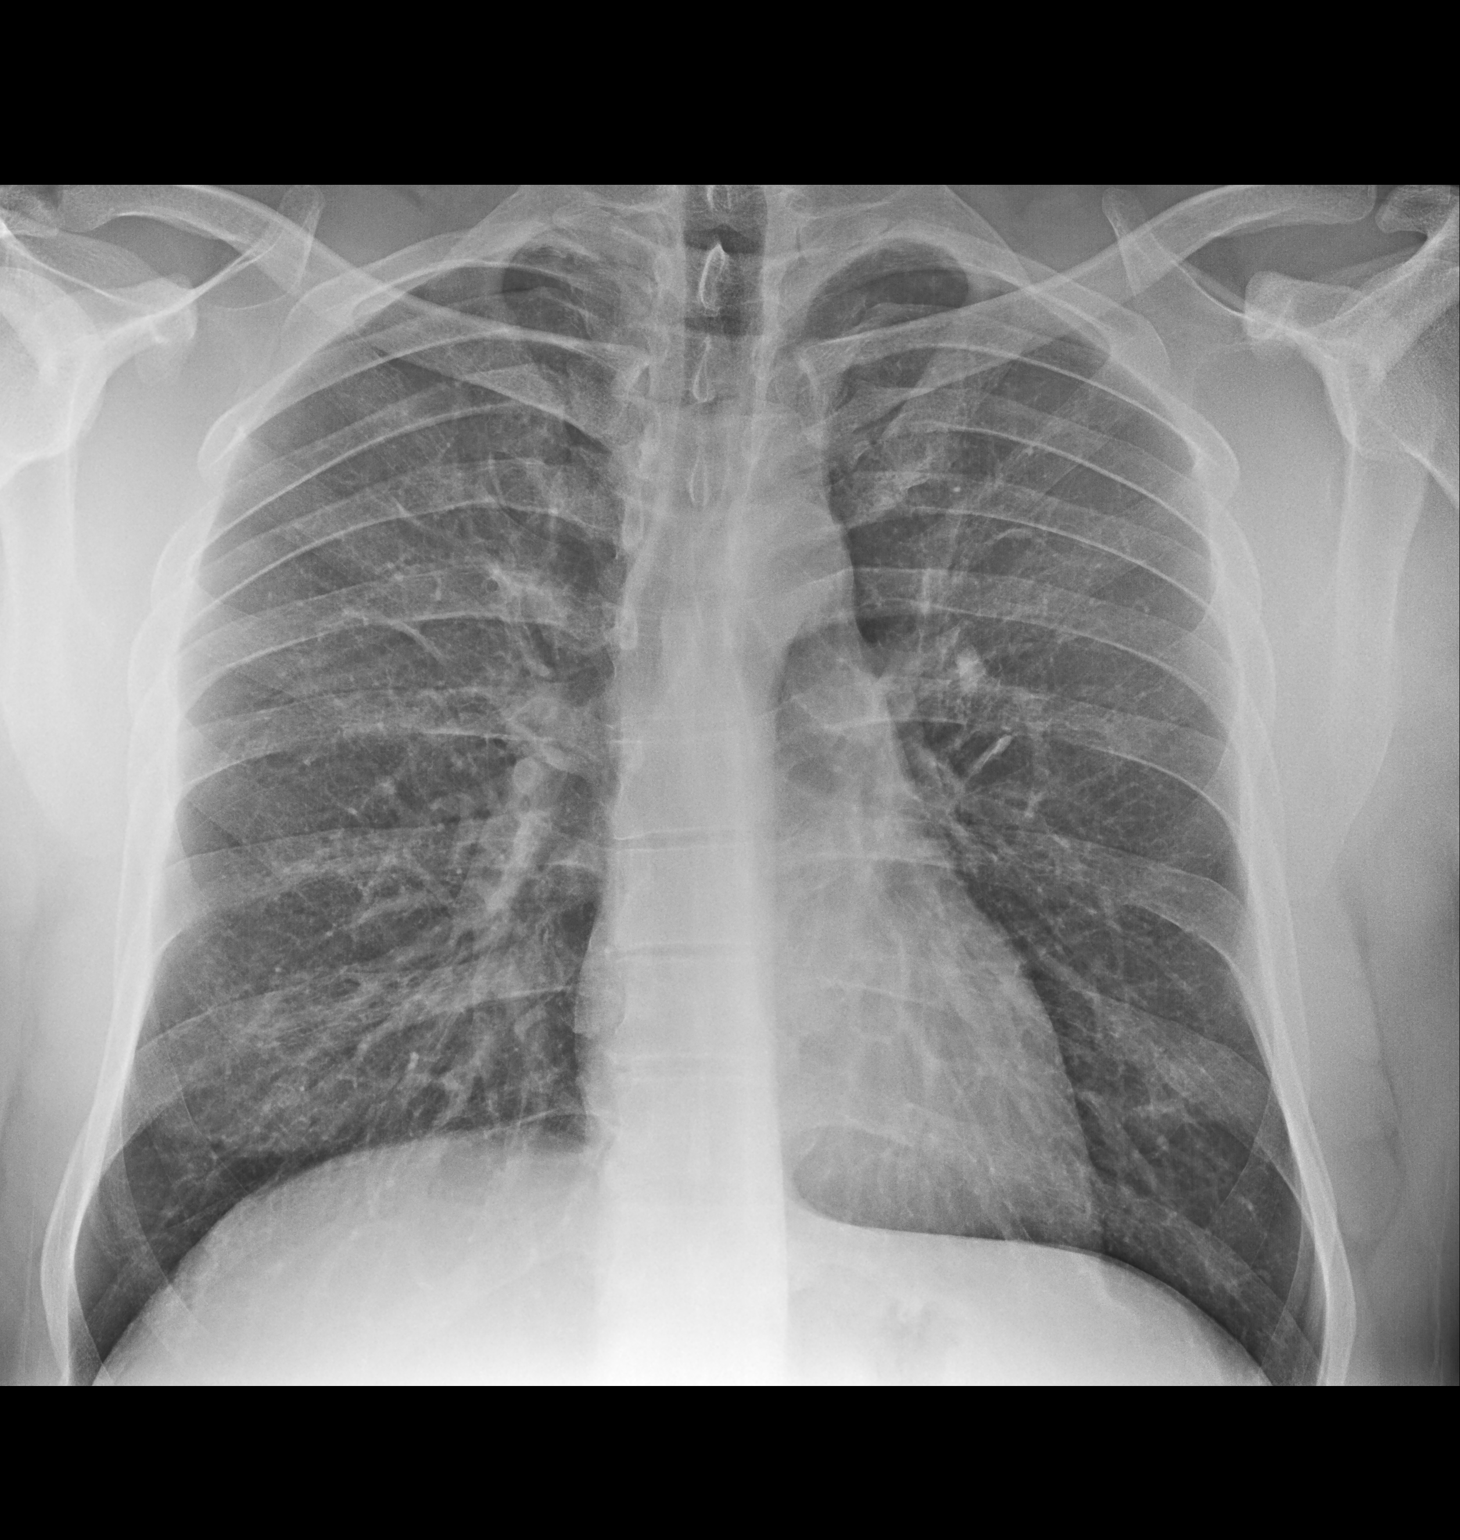

[chest lat]
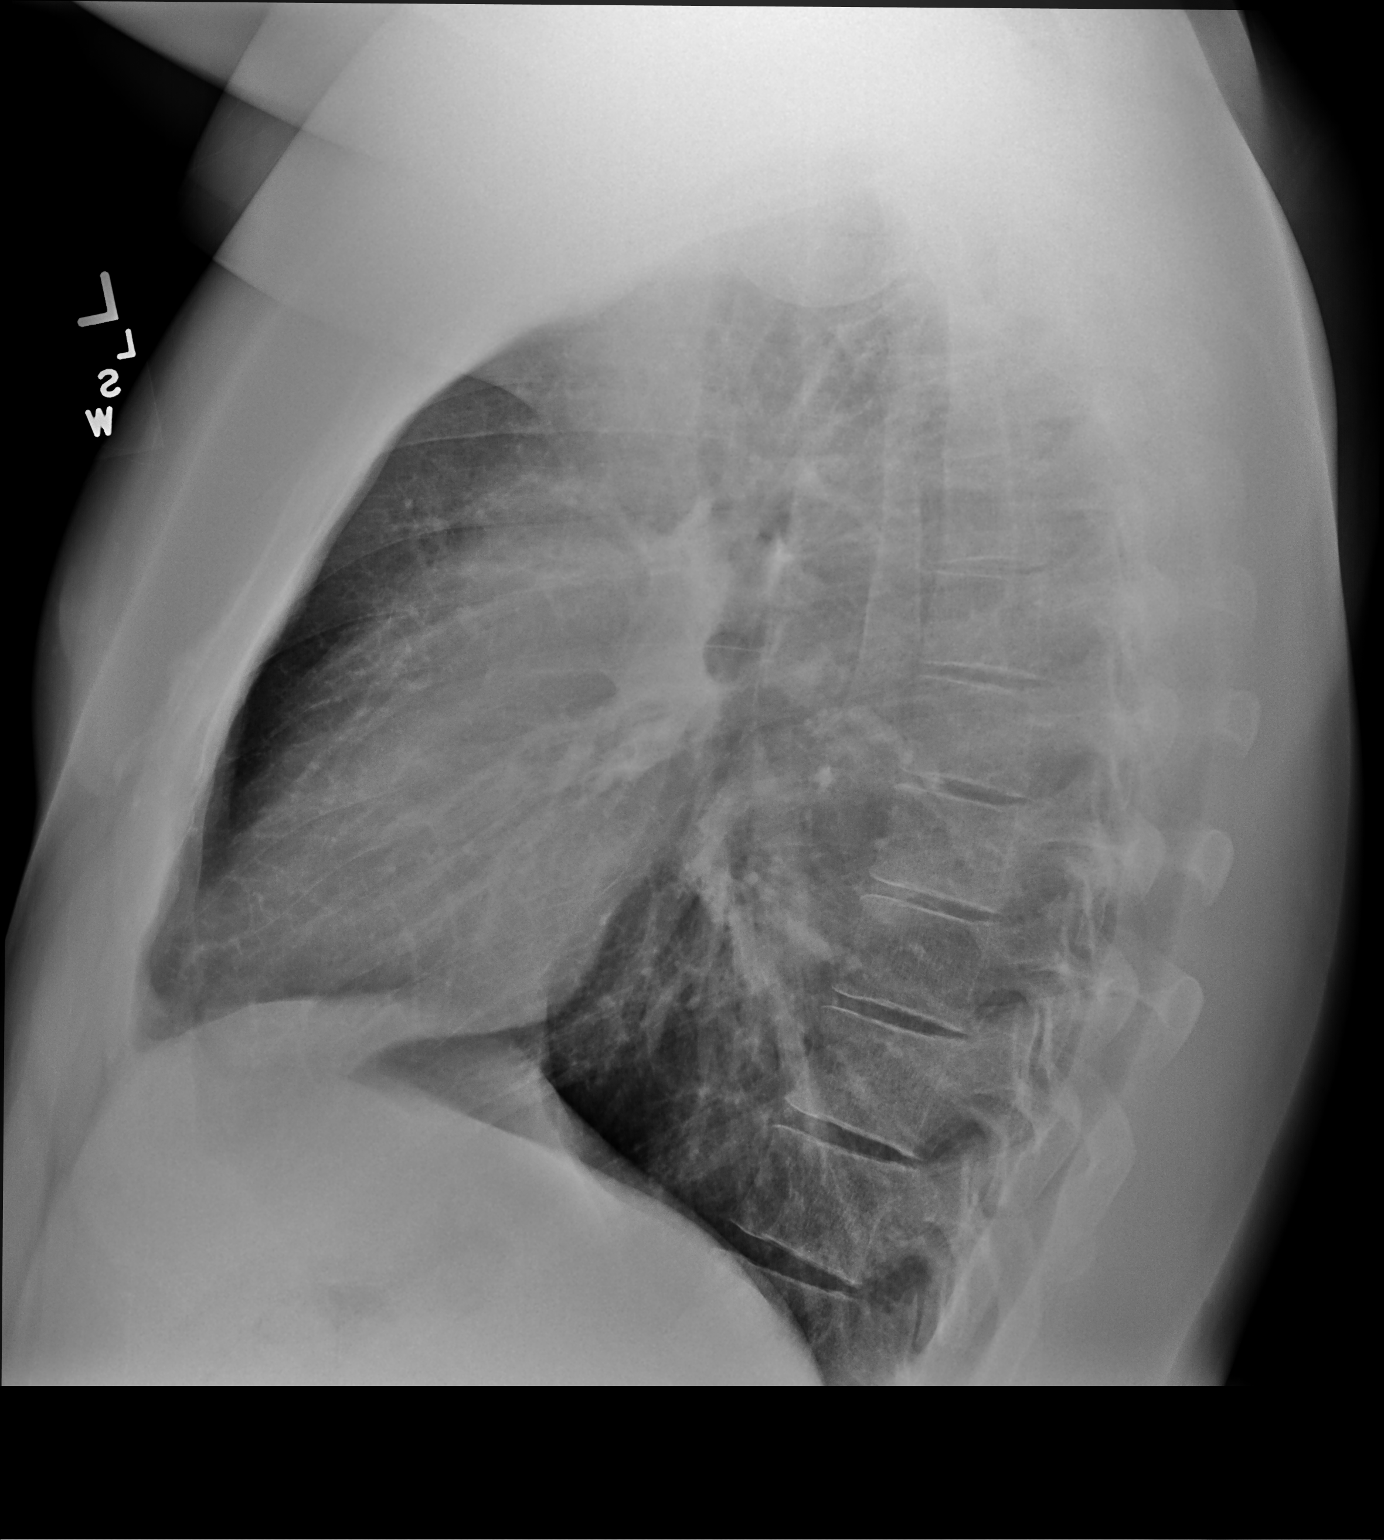

[2 of 2 positions shown; findings below may reference images not displayed]

FINDINGS: No pneumonia or effusion is seen. Minimally prominent markings are
present in the right suprahilar region but these appear to be
vascular in origin. Mediastinal and hilar contours are unremarkable.
The heart is within normal limits in size. No bony abnormality is
seen.
IMPRESSION: No active cardiopulmonary disease.

## 2017-06-08 DIAGNOSIS — I10 Essential (primary) hypertension: Secondary | ICD-10-CM | POA: Diagnosis not present

## 2017-06-08 DIAGNOSIS — M362 Hemophilic arthropathy: Secondary | ICD-10-CM | POA: Diagnosis not present

## 2017-06-15 ENCOUNTER — Encounter: Payer: Self-pay | Admitting: Family Medicine

## 2017-06-15 ENCOUNTER — Ambulatory Visit: Payer: 59 | Admitting: Family Medicine

## 2017-06-15 VITALS — BP 138/86 | HR 84 | Temp 97.9°F | Wt 268.0 lb

## 2017-06-15 DIAGNOSIS — I10 Essential (primary) hypertension: Secondary | ICD-10-CM

## 2017-06-15 NOTE — Patient Instructions (Addendum)
Increase amlodipine to 10mg  daily. Update me with readings after 2-3 weeks on higher dose, and watch for ankle swelling. Look for extra large bp cuff.  Your goal blood pressure is <140/90, ideally <130/85.  Work on low salt/sodium diet - goal <1.5gm (1,500mg ) per day. Eat a diet high in fruits/vegetables and whole grains.  Look into mediterranean and DASH diet. Goal activity is 139min/wk of moderate intensity aerobic exercise. Look at www.heart.org for more resources.   DASH Eating Plan DASH stands for "Dietary Approaches to Stop Hypertension." The DASH eating plan is a healthy eating plan that has been shown to reduce high blood pressure (hypertension). It may also reduce your risk for type 2 diabetes, heart disease, and stroke. The DASH eating plan may also help with weight loss. What are tips for following this plan? General guidelines  Avoid eating more than 2,300 mg (milligrams) of salt (sodium) a day. If you have hypertension, you may need to reduce your sodium intake to 1,500 mg a day.  Limit alcohol intake to no more than 1 drink a day for nonpregnant women and 2 drinks a day for men. One drink equals 12 oz of beer, 5 oz of wine, or 1 oz of hard liquor.  Work with your health care provider to maintain a healthy body weight or to lose weight. Ask what an ideal weight is for you.  Get at least 30 minutes of exercise that causes your heart to beat faster (aerobic exercise) most days of the week. Activities may include walking, swimming, or biking.  Work with your health care provider or diet and nutrition specialist (dietitian) to adjust your eating plan to your individual calorie needs. Reading food labels  Check food labels for the amount of sodium per serving. Choose foods with less than 5 percent of the Daily Value of sodium. Generally, foods with less than 300 mg of sodium per serving fit into this eating plan.  To find whole grains, look for the word "whole" as the first word in  the ingredient list. Shopping  Buy products labeled as "low-sodium" or "no salt added."  Buy fresh foods. Avoid canned foods and premade or frozen meals. Cooking  Avoid adding salt when cooking. Use salt-free seasonings or herbs instead of table salt or sea salt. Check with your health care provider or pharmacist before using salt substitutes.  Do not fry foods. Cook foods using healthy methods such as baking, boiling, grilling, and broiling instead.  Cook with heart-healthy oils, such as olive, canola, soybean, or sunflower oil. Meal planning   Eat a balanced diet that includes: ? 5 or more servings of fruits and vegetables each day. At each meal, try to fill half of your plate with fruits and vegetables. ? Up to 6-8 servings of whole grains each day. ? Less than 6 oz of lean meat, poultry, or fish each day. A 3-oz serving of meat is about the same size as a deck of cards. One egg equals 1 oz. ? 2 servings of low-fat dairy each day. ? A serving of nuts, seeds, or beans 5 times each week. ? Heart-healthy fats. Healthy fats called Omega-3 fatty acids are found in foods such as flaxseeds and coldwater fish, like sardines, salmon, and mackerel.  Limit how much you eat of the following: ? Canned or prepackaged foods. ? Food that is high in trans fat, such as fried foods. ? Food that is high in saturated fat, such as fatty meat. ? Sweets, desserts, sugary  drinks, and other foods with added sugar. ? Full-fat dairy products.  Do not salt foods before eating.  Try to eat at least 2 vegetarian meals each week.  Eat more home-cooked food and less restaurant, buffet, and fast food.  When eating at a restaurant, ask that your food be prepared with less salt or no salt, if possible. What foods are recommended? The items listed may not be a complete list. Talk with your dietitian about what dietary choices are best for you. Grains Whole-grain or whole-wheat bread. Whole-grain or  whole-wheat pasta. Brown rice. [place]row. Bulgur. Whole-grain and low-sodium cereals. Pita bread. Low-fat, low-sodium crackers. Whole-wheat flour tortillas. Vegetables Fresh or frozen vegetables (raw, steamed, roasted, or grilled). Low-sodium or reduced-sodium tomato and vegetable juice. Low-sodium or reduced-sodium tomato sauce and tomato paste. Low-sodium or reduced-sodium canned vegetables. Fruits All fresh, dried, or frozen fruit. Canned fruit in natural juice (without added sugar). Meat and other protein foods Skinless chicken or [place]. Ground chicken or [place]. Pork with fat trimmed off. Fish and seafood. Egg whites. Dried beans, peas, or lentils. Unsalted nuts, nut butters, and seeds. Unsalted canned beans. Lean cuts of beef with fat trimmed off. Low-sodium, lean deli meat. Dairy Low-fat (1%) or fat-free (skim) milk. Fat-free, low-fat, or reduced-fat cheeses. Nonfat, low-sodium ricotta or cottage cheese. Low-fat or nonfat yogurt. Low-fat, low-sodium cheese. Fats and oils Soft margarine without trans fats. Vegetable oil. Low-fat, reduced-fat, or light mayonnaise and salad dressings (reduced-sodium). Canola, safflower, olive, soybean, and sunflower oils. Avocado. Seasoning and other foods Herbs. Spices. Seasoning mixes without salt. Unsalted popcorn and pretzels. Fat-free sweets. What foods are not recommended? The items listed may not be a complete list. Talk with your dietitian about what dietary choices are best for you. Grains Baked goods made with fat, such as croissants, muffins, or some breads. Dry pasta or rice meal packs. Vegetables Creamed or fried vegetables. Vegetables in a cheese sauce. Regular canned vegetables (not low-sodium or reduced-sodium). Regular canned tomato sauce and paste (not low-sodium or reduced-sodium). Regular tomato and vegetable juice (not low-sodium or reduced-sodium). Angie Fava. Olives. Fruits Canned fruit in a light or heavy syrup. Fried fruit. Fruit  in cream or butter sauce. Meat and other protein foods Fatty cuts of meat. Ribs. Fried meat. Berniece Salines. Sausage. Bologna and other processed lunch meats. Salami. Fatback. Hotdogs. Bratwurst. Salted nuts and seeds. Canned beans with added salt. Canned or smoked fish. Whole eggs or egg yolks. Chicken or [place] with skin. Dairy Whole or 2% milk, cream, and half-and-half. Whole or full-fat cream cheese. Whole-fat or sweetened yogurt. Full-fat cheese. Nondairy creamers. Whipped toppings. Processed cheese and cheese spreads. Fats and oils Butter. Stick margarine. Lard. Shortening. Ghee. Bacon fat. Tropical oils, such as coconut, palm kernel, or palm oil. Seasoning and other foods Salted popcorn and pretzels. Onion salt, garlic salt, seasoned salt, table salt, and sea salt. Worcestershire sauce. Tartar sauce. Barbecue sauce. Teriyaki sauce. Soy sauce, including reduced-sodium. Steak sauce. Canned and packaged gravies. Fish sauce. Oyster sauce. Cocktail sauce. Horseradish that you find on the shelf. Ketchup. Mustard. Meat flavorings and tenderizers. Bouillon cubes. Hot sauce and Tabasco sauce. Premade or packaged marinades. Premade or packaged taco seasonings. Relishes. Regular salad dressings. Where to find more information:  National Heart, Lung, and [place]: https://wilson-eaton.com/  American Heart Association: www.heart.org Summary  The DASH eating plan is a healthy eating plan that has been shown to reduce high blood pressure (hypertension). It may also reduce your risk for type 2 diabetes, heart disease, and  stroke.  With the DASH eating plan, you should limit salt (sodium) intake to 2,300 mg a day. If you have hypertension, you may need to reduce your sodium intake to 1,500 mg a day.  When on the DASH eating plan, aim to eat more fresh fruits and vegetables, whole grains, lean proteins, low-fat dairy, and heart-healthy fats.  Work with your health care provider or diet and nutrition specialist  (dietitian) to adjust your eating plan to your individual calorie needs. This information is not intended to replace advice given to you by your health care provider. Make sure you discuss any questions you have with your health care provider. Document Released: 02/18/2011 Document Revised: 02/23/2016 Document Reviewed: 02/23/2016 Elsevier Interactive Patient Education  [employer]2018 Elsevier Inc.

## 2017-06-15 NOTE — Assessment & Plan Note (Addendum)
Chronic, recent increased trend in BPs - discussed ideal lower goal BP given young age. Will try increasing amlodipine to 10mg  daily and monitor effect on ankle swelling, will monitor BP and notify me via MyChart in 2 wks with readings and tolerance, further titrate at that time (consider prescribing amlodipine 10mg  dose vs addition of low dose ARB as ACEI caused cough). Reviewed dietary and lifestyle changes to help attain better hypertensive control including DASH diet and low salt diet.  Reviewed optimal technique for checking blood pressures at home, discussed optimal arm cuff - he may need to buy separate extra large cuff, will research online.

## 2017-06-15 NOTE — Progress Notes (Signed)
BP 138/86 (BP Location: Right Arm, Cuff Size: Large)   Pulse 84   Temp 97.9 F (36.6 C) (Oral)   Wt 268 lb (121.6 kg)   SpO2 97%   BMI 35.36 kg/m    CC: HTN f/u Subjective:    Patient ID: Michael Barker, male    DOB: Jan 21, 1986, 32 y.o.   MRN: [id number]  HPI: Michael Barker is a 32 y.o. male presenting on 06/15/2017 for Discuss BP (Was seen by hematology recently, BP was elevated. Suggested pt see PCP. Pt brought in home BP monitor, initial reading 140/90.)   Saw heme last week - bp was 130/80s. Initial reading was 149 systolic. He bought wrist BP cuff and brings log - 129-144/77-90.  Has noticed mild swelling of ankles after salty meals Denies HA, vision changes, CP/tightness, SOB.   Was drinking liquor on weekends - he has since stopped this.  Limiting salt in diet.  Avoids processed foods. Good vegetable intake.  Regularly goes to gym - 2 days/wk - intensive cardio and weight training.   Relevant past medical, surgical, family and social history reviewed and updated as indicated. Interim medical history since our last visit reviewed. Allergies and medications reviewed and updated. Outpatient Medications Prior to Visit  Medication Sig Dispense Refill  . amLODipine (NORVASC) 5 MG tablet TAKE 1 TABLET BY MOUTH EVERY DAY 90 tablet 3  . Coagulation Factor IX, rFIXFc, (ALPROLIX) 3000 units SOLR Inject into the vein. Every 10 days    . ALPROLIX 4000 units SOLR Every 10 days    . COAGULATION FACTOR IX IV Inject into the vein.    . benazepril (LOTENSIN) 10 MG tablet TAKE 1 TABLET (10 MG TOTAL) BY MOUTH DAILY. 30 tablet 0  . omeprazole (PRILOSEC) 20 MG capsule Take 20 mg by mouth.     No facility-administered medications prior to visit.      Per HPI unless specifically indicated in ROS section below Review of Systems     Objective:    BP 138/86 (BP Location: Right Arm, Cuff Size: Large)   Pulse 84   Temp 97.9 F (36.6 C) (Oral)   Wt 268 lb (121.6 kg)    SpO2 97%   BMI 35.36 kg/m   Wt Readings from Last 3 Encounters:  06/15/17 268 lb (121.6 kg)  04/07/16 252 lb 12 oz (114.6 kg)  03/02/16 255 lb (115.7 kg)    Physical Exam  Constitutional: He appears well-developed and well-nourished. No distress.  HENT:  Mouth/Throat: Oropharynx is clear and moist. No oropharyngeal exudate.  Cardiovascular: Normal rate, regular rhythm, normal heart sounds and intact distal pulses.  No murmur heard. Pulmonary/Chest: Effort normal and breath sounds normal. No respiratory distress. He has no wheezes. He has no rales.  Musculoskeletal: He exhibits no edema.  Nursing note and vitals reviewed.  Lab Results  Component Value Date   CREATININE 1.01 05/07/2015       Assessment & Plan:   Problem List Items Addressed This Visit    HTN (hypertension) - Primary    Chronic, recent increased trend in BPs - discussed ideal lower goal BP given young age. Will try increasing amlodipine to 10mg  daily and monitor effect on ankle swelling, will monitor BP and notify me via MyChart in 2 wks with readings and tolerance, further titrate at that time (consider prescribing amlodipine 10mg  dose vs addition of low dose ARB as ACEI caused cough). Reviewed dietary and lifestyle changes to help attain better hypertensive control including DASH  diet and low salt diet.  Reviewed optimal technique for checking blood pressures at home, discussed optimal arm cuff - he may need to buy separate extra large cuff, will research online.           No orders of the defined types were placed in this encounter.  No orders of the defined types were placed in this encounter.   Follow up plan: Return if symptoms worsen or fail to improve.  Eustaquio BoydenJavier Fatin Bachicha, MD

## 2017-06-21 ENCOUNTER — Encounter: Payer: Self-pay | Admitting: Family Medicine

## 2017-06-23 ENCOUNTER — Encounter: Payer: Self-pay | Admitting: Family Medicine

## 2017-07-04 ENCOUNTER — Encounter: Payer: Self-pay | Admitting: Family Medicine

## 2017-07-05 ENCOUNTER — Other Ambulatory Visit: Payer: Self-pay | Admitting: Family Medicine

## 2017-07-05 NOTE — Telephone Encounter (Signed)
New rx request from pharmacy was forwarded to Dr. Reece AgarG.

## 2017-07-05 NOTE — Telephone Encounter (Signed)
Amlodipine increased to 10 mg at last OV, 06/15/17.    Was not sure if I could sent initial rx with the change.

## 2017-09-05 ENCOUNTER — Encounter: Payer: Self-pay | Admitting: Family Medicine

## 2017-09-05 NOTE — Telephone Encounter (Signed)
Relation to pt: self  Call back number:  [phone]    Reason for call:  Patient following up on my chart message and would like a nurse to call him directly today, please advise

## 2017-09-06 MED ORDER — VALSARTAN 80 MG PO TABS: 80.0000 mg | ORAL_TABLET | Freq: Every day | ORAL | 6 refills | Status: DC

## 2017-09-06 MED ORDER — AMLODIPINE BESYLATE 5 MG PO TABS: 5.0000 mg | ORAL_TABLET | Freq: Every day | ORAL | 1 refills | Status: AC

## 2017-09-06 NOTE — Telephone Encounter (Signed)
replied via my chart

## 2017-10-27 DIAGNOSIS — I10 Essential (primary) hypertension: Secondary | ICD-10-CM | POA: Diagnosis not present

## 2017-11-26 ENCOUNTER — Other Ambulatory Visit: Payer: Self-pay | Admitting: Family Medicine

## 2017-11-29 NOTE — Telephone Encounter (Signed)
Note from the pharmacy is requesting alternative to valsartan and 90-day rx due to cost.  Recommend losartan at $18 or irbesartan at $33.

## 2017-12-02 NOTE — Telephone Encounter (Signed)
Sent in

## 2017-12-15 DIAGNOSIS — I10 Essential (primary) hypertension: Secondary | ICD-10-CM | POA: Diagnosis not present

## 2018-03-13 DIAGNOSIS — I10 Essential (primary) hypertension: Secondary | ICD-10-CM | POA: Diagnosis not present

## 2018-03-24 DIAGNOSIS — I10 Essential (primary) hypertension: Secondary | ICD-10-CM | POA: Diagnosis not present

## 2018-03-24 DIAGNOSIS — E669 Obesity, unspecified: Secondary | ICD-10-CM | POA: Diagnosis not present

## 2018-04-01 ENCOUNTER — Other Ambulatory Visit: Payer: Self-pay | Admitting: Family Medicine

## 2018-04-03 NOTE — Telephone Encounter (Signed)
Valsartan was switched to Losartan in September 2019 and last office visit was in April 2019. No future appointments scheduled. Does patient need a follow up appointment?

## 2018-07-07 ENCOUNTER — Other Ambulatory Visit: Payer: Self-pay | Admitting: Family Medicine

## 2018-07-26 DIAGNOSIS — Z6832 Body mass index (BMI) 32.0-32.9, adult: Secondary | ICD-10-CM | POA: Diagnosis not present

## 2018-08-05 ENCOUNTER — Other Ambulatory Visit: Payer: Self-pay | Admitting: Family Medicine

## 2018-08-08 NOTE — Telephone Encounter (Signed)
Last visit was in April 2019. No future appointments. Need to follow up before refilling?

## 2018-08-09 NOTE — Telephone Encounter (Signed)
Asked to schedule CPE prior to more refills.

## 2018-10-16 ENCOUNTER — Other Ambulatory Visit: Payer: Self-pay | Admitting: Family Medicine

## 2018-10-17 NOTE — Telephone Encounter (Signed)
Patient returned call from the office. He stated that he is currently seeing another provider and the pharmacy did not need to send this refill to [place],   Please disregard refill request

## 2018-10-17 NOTE — Telephone Encounter (Signed)
Noted.  Request denied.  

## 2018-10-17 NOTE — Telephone Encounter (Signed)
Left message for patient to call back. Needs CPE or Follow up prior to refills.

## 2019-11-21 ENCOUNTER — Other Ambulatory Visit: Payer: Self-pay

## 2019-11-21 DIAGNOSIS — Z20822 Contact with and (suspected) exposure to covid-19: Secondary | ICD-10-CM

## 2019-11-23 LAB — SARS-COV-2, NAA 2 DAY TAT

## 2019-11-23 LAB — NOVEL CORONAVIRUS, NAA: SARS-CoV-2, NAA: NOT DETECTED

## 2022-02-23 ENCOUNTER — Telehealth: Payer: BC Managed Care – PPO | Admitting: Physician Assistant

## 2022-02-23 DIAGNOSIS — J019 Acute sinusitis, unspecified: Secondary | ICD-10-CM | POA: Diagnosis not present

## 2022-02-23 DIAGNOSIS — B9689 Other specified bacterial agents as the cause of diseases classified elsewhere: Secondary | ICD-10-CM | POA: Diagnosis not present

## 2022-02-23 MED ORDER — DOXYCYCLINE HYCLATE 100 MG PO TABS: 100.0000 mg | ORAL_TABLET | Freq: Two times a day (BID) | ORAL | 0 refills | Status: AC

## 2022-02-23 NOTE — Progress Notes (Signed)
Virtual Visit Consent   [name], you are scheduled for a virtual visit with a [facility] provider today. Just as with appointments in the office, your consent must be obtained to participate. Your consent will be active for this visit and any virtual visit you may have with one of our providers in the next 365 days. If you have a MyChart account, a copy of this consent can be sent to you electronically.  As this is a virtual visit, video technology does not allow for your provider to perform a traditional examination. This may limit your provider's ability to fully assess your condition. If your provider identifies any concerns that need to be evaluated in person or the need to arrange testing (such as labs, EKG, etc.), we will make arrangements to do so. Although advances in technology are sophisticated, we cannot ensure that it will always work on either your end or our end. If the connection with a video visit is poor, the visit may have to be switched to a telephone visit. With either a video or telephone visit, we are not always able to ensure that we have a secure connection.  By engaging in this virtual visit, you consent to the provision of healthcare and authorize for your insurance to be billed (if applicable) for the services provided during this visit. Depending on your insurance coverage, you may receive a charge related to this service.  I need to obtain your verbal consent now. Are you willing to proceed with your visit today? Michael Barker has provided verbal consent on 02/23/2022 for a virtual visit (video or telephone). Piedad Climes, [place]  Date: 02/23/2022 6:34 PM  Virtual Visit via Video Note   I, Piedad Climes, connected with  Michael Barker  ([id number], [birth year]) on 02/23/22 at  6:30 PM EST by a video-enabled telemedicine application and verified that I am speaking with the correct person using two identifiers.  Location: Patient:  Virtual Visit Location Patient: Home Provider: Virtual Visit Location Provider: Home Office   I discussed the limitations of evaluation and management by telemedicine and the availability of in person appointments. The patient expressed understanding and agreed to proceed.    History of Present Illness: Michael Barker is a 36 y.o. who identifies as a male who was assigned male at birth, and is being seen today for possible sinusitis.  HPI: HPI  Problems:  Patient Active Problem List   Diagnosis Date Noted   Anxiety state 03/02/2016   Oropharyngeal dysphagia 03/02/2016   Gynecomastia 03/02/2016   GIB (gastrointestinal bleeding) 12/09/2015   Hx of hepatitis C    Hemophilia B (HCC)    GERD (gastroesophageal reflux disease)    HTN (hypertension)    Obesity, Class I, BMI 30-34.9     Allergies:  Allergies  Allergen Reactions   Asa [Aspirin] Other (See Comments)    Hemophilia   Benazepril Other (See Comments)    cough   Nsaids Other (See Comments)    Patient with hemophilia. Bleeding complicaitons   Medications:  Current Outpatient Medications:    doxycycline (VIBRA-TABS) 100 MG tablet, Take 1 tablet (100 mg total) by mouth 2 (two) times daily., Disp: 20 tablet, Rfl: 0   ALPROLIX 4000 units SOLR, Every 10 days, Disp: , Rfl:    amLODipine (NORVASC) 5 MG tablet, Take 1 tablet (5 mg total) by mouth daily., Disp: 90 tablet, Rfl: 1   COAGULATION FACTOR IX IV, Inject into the vein., Disp: ,  Rfl:    Coagulation Factor IX, rFIXFc, (ALPROLIX) 3000 units SOLR, Inject into the vein. Every 10 days, Disp: , Rfl:    hydrochlorothiazide (HYDRODIURIL) 25 MG tablet, Take 25 mg by mouth daily., Disp: , Rfl:    losartan (COZAAR) 25 MG tablet, TAKE 1 TABLET (25 MG TOTAL) BY MOUTH DAILY. SCHEDULE APPOINTMENT FOR MORE REFILLS, Disp: 30 tablet, Rfl: 1  Observations/Objective: Patient is well-developed, well-nourished in no acute distress.  Resting comfortably at home.  Head is normocephalic,  atraumatic.  No labored breathing. Speech is clear and coherent with logical content.  Patient is alert and oriented at baseline.   Assessment and Plan: 1. Acute bacterial sinusitis - doxycycline (VIBRA-TABS) 100 MG tablet; Take 1 tablet (100 mg total) by mouth 2 (two) times daily.  Dispense: 20 tablet; Refill: 0  Rx doxycycline.  Increase fluids.  Rest.  Saline nasal spray.  Probiotic.  Mucinex as directed.  Humidifier in bedroom.  Start Flonase and Zyrtec OTC.  Call or return to clinic if symptoms are not improving.   Follow Up Instructions: I discussed the assessment and treatment plan with the patient. The patient was provided an opportunity to ask questions and all were answered. The patient agreed with the plan and demonstrated an understanding of the instructions.  A copy of instructions were sent to the patient via MyChart unless otherwise noted below.   The patient was advised to call back or seek an in-person evaluation if the symptoms worsen or if the condition fails to improve as anticipated.  Time:  I spent 10 minutes with the patient via telehealth technology discussing the above problems/concerns.    Piedad Climes, PA-C

## 2022-02-23 NOTE — Patient Instructions (Signed)
Zettie Pho, thank you for joining Piedad Climes, PA-C for today's virtual visit.  While this provider is not your primary care provider (PCP), if your PCP is located in our provider database this encounter information will be shared with them immediately following your visit.   A [facility] MyChart account gives you access to today's visit and all your visits, tests, and labs performed at [facility] " click here if you don't have a [facility] MyChart account or go to [email or web address]  Consent: (Patient) Zettie Pho provided verbal consent for this virtual visit at the beginning of the encounter.  Current Medications:  Current Outpatient Medications:    ALPROLIX 4000 units SOLR, Every 10 days, Disp: , Rfl:    amLODipine (NORVASC) 5 MG tablet, Take 1 tablet (5 mg total) by mouth daily., Disp: 90 tablet, Rfl: 1   COAGULATION FACTOR IX IV, Inject into the vein., Disp: , Rfl:    Coagulation Factor IX, rFIXFc, (ALPROLIX) 3000 units SOLR, Inject into the vein. Every 10 days, Disp: , Rfl:    losartan (COZAAR) 25 MG tablet, TAKE 1 TABLET (25 MG TOTAL) BY MOUTH DAILY. SCHEDULE APPOINTMENT FOR MORE REFILLS, Disp: 30 tablet, Rfl: 1   Medications ordered in this encounter:  No orders of the defined types were placed in this encounter.    *If you need refills on other medications prior to your next appointment, please contact your pharmacy*  Follow-Up: Call back or seek an in-person evaluation if the symptoms worsen or if the condition fails to improve as anticipated.  [facility] [phone]  Other Instructions Please take antibiotic as directed.  Increase fluid intake.  Use Saline nasal spray.  Take a daily multivitamin.  Start over-the-counter Flonase and a daily Claritin or Zyrtec.  Place a humidifier in the bedroom.  Please call or return clinic if symptoms are not improving.  Sinusitis Sinusitis is redness, soreness, and  swelling (inflammation) of the paranasal sinuses. Paranasal sinuses are air pockets within the bones of your face (beneath the eyes, the middle of the forehead, or above the eyes). In healthy paranasal sinuses, mucus is able to drain out, and air is able to circulate through them by way of your nose. However, when your paranasal sinuses are inflamed, mucus and air can become trapped. This can allow bacteria and other germs to grow and cause infection. Sinusitis can develop quickly and last only a short time (acute) or continue over a long period (chronic). Sinusitis that lasts for more than 12 weeks is considered chronic.  CAUSES  Causes of sinusitis include: Allergies. Structural abnormalities, such as displacement of the cartilage that separates your nostrils (deviated septum), which can decrease the air flow through your nose and sinuses and affect sinus drainage. Functional abnormalities, such as when the small hairs (cilia) that line your sinuses and help remove mucus do not work properly or are not present. SYMPTOMS  Symptoms of acute and chronic sinusitis are the same. The primary symptoms are pain and pressure around the affected sinuses. Other symptoms include: Upper toothache. Earache. Headache. Bad breath. Decreased sense of smell and taste. A cough, which worsens when you are lying flat. Fatigue. Fever. Thick drainage from your nose, which often is green and may contain pus (purulent). Swelling and warmth over the affected sinuses. DIAGNOSIS  Your caregiver will perform a physical exam. During the exam, your caregiver may: Look in your nose for signs of abnormal growths in your nostrils (nasal polyps).  Tap over the affected sinus to check for signs of infection. View the inside of your sinuses (endoscopy) with a special imaging device with a light attached (endoscope), which is inserted into your sinuses. If your caregiver suspects that you have chronic sinusitis, one or more of  the following tests may be recommended: Allergy tests. Nasal culture A sample of mucus is taken from your nose and sent to a lab and screened for bacteria. Nasal cytology A sample of mucus is taken from your nose and examined by your caregiver to determine if your sinusitis is related to an allergy. TREATMENT  Most cases of acute sinusitis are related to a viral infection and will resolve on their own within 10 days. Sometimes medicines are prescribed to help relieve symptoms (pain medicine, decongestants, nasal steroid sprays, or saline sprays).  However, for sinusitis related to a bacterial infection, your caregiver will prescribe antibiotic medicines. These are medicines that will help kill the bacteria causing the infection.  Rarely, sinusitis is caused by a fungal infection. In theses cases, your caregiver will prescribe antifungal medicine. For some cases of chronic sinusitis, surgery is needed. Generally, these are cases in which sinusitis recurs more than 3 times per year, despite other treatments. HOME CARE INSTRUCTIONS  Drink plenty of water. Water helps thin the mucus so your sinuses can drain more easily. Use a humidifier. Inhale steam 3 to 4 times a day (for example, sit in the bathroom with the shower running). Apply a warm, moist washcloth to your face 3 to 4 times a day, or as directed by your caregiver. Use saline nasal sprays to help moisten and clean your sinuses. Take over-the-counter or prescription medicines for pain, discomfort, or fever only as directed by your caregiver. SEEK IMMEDIATE MEDICAL CARE IF: You have increasing pain or severe headaches. You have nausea, vomiting, or drowsiness. You have swelling around your face. You have vision problems. You have a stiff neck. You have difficulty breathing. MAKE SURE YOU:  Understand these instructions. Will watch your condition. Will get help right away if you are not doing well or get worse. Document Released:  03/01/2005 Document Revised: 05/24/2011 Document Reviewed: 03/16/2011 [facility] Patient Information 2014 [place], [place].    If you have been instructed to have an in-person evaluation today at a local Urgent Care facility, please use the link below. It will take you to a list of all of our available [facility], including address, phone number and hours of operation. Please do not delay care.  [facility]  If you or a family member do not have a primary care provider, use the link below to schedule a visit and establish care. When you choose a [facility] primary care physician or advanced practice provider, you gain a long-term partner in health. Find a Primary Care Provider  Learn more about [facility]'s in-office and virtual care options: [facility] - Get Care Now

## 2022-02-26 ENCOUNTER — Other Ambulatory Visit: Payer: Self-pay | Admitting: Family Medicine

## 2022-02-26 DIAGNOSIS — I10 Essential (primary) hypertension: Secondary | ICD-10-CM

## 2022-02-26 DIAGNOSIS — E7800 Pure hypercholesterolemia, unspecified: Secondary | ICD-10-CM

## 2022-02-26 DIAGNOSIS — Z9189 Other specified personal risk factors, not elsewhere classified: Secondary | ICD-10-CM

## 2022-04-06 ENCOUNTER — Ambulatory Visit
Admission: RE | Admit: 2022-04-06 | Discharge: 2022-04-06 | Disposition: A | Discharge status: Home / Self Care | Payer: Self-pay | Source: Ambulatory Visit | Attending: Family Medicine | Admitting: Family Medicine

## 2022-04-06 DIAGNOSIS — I10 Essential (primary) hypertension: Secondary | ICD-10-CM | POA: Insufficient documentation

## 2022-04-06 DIAGNOSIS — E7800 Pure hypercholesterolemia, unspecified: Secondary | ICD-10-CM | POA: Insufficient documentation

## 2022-04-06 DIAGNOSIS — Z9189 Other specified personal risk factors, not elsewhere classified: Secondary | ICD-10-CM | POA: Insufficient documentation
